# Patient Record
Sex: Male | Born: 1935
Health system: Southern US, Community
[De-identification: ages and names within clinical notes are randomized; demographics above are authoritative.]

## PROBLEM LIST (undated history)

## (undated) DIAGNOSIS — K219 Gastro-esophageal reflux disease without esophagitis: Secondary | ICD-10-CM

## (undated) DIAGNOSIS — I10 Essential (primary) hypertension: Secondary | ICD-10-CM

## (undated) DIAGNOSIS — I714 Abdominal aortic aneurysm, without rupture, unspecified: Secondary | ICD-10-CM

## (undated) DIAGNOSIS — M1990 Unspecified osteoarthritis, unspecified site: Secondary | ICD-10-CM

## (undated) DIAGNOSIS — N189 Chronic kidney disease, unspecified: Secondary | ICD-10-CM

## (undated) DIAGNOSIS — E039 Hypothyroidism, unspecified: Secondary | ICD-10-CM

## (undated) DIAGNOSIS — E7800 Pure hypercholesterolemia, unspecified: Secondary | ICD-10-CM

## (undated) DIAGNOSIS — C61 Malignant neoplasm of prostate: Secondary | ICD-10-CM

## (undated) HISTORY — DX: Essential (primary) hypertension: I10

## (undated) HISTORY — PX: OTHER SURGICAL HISTORY: SHX169

---

## 2007-06-28 ENCOUNTER — Ambulatory Visit: Admission: RE | Admit: 2007-06-28 | Discharge: 2007-09-26 | Payer: Self-pay | Admitting: Radiation Oncology

## 2007-07-05 ENCOUNTER — Encounter: Admission: RE | Admit: 2007-07-05 | Discharge: 2007-07-05 | Payer: Self-pay | Admitting: Urology

## 2007-08-16 ENCOUNTER — Ambulatory Visit (HOSPITAL_BASED_OUTPATIENT_CLINIC_OR_DEPARTMENT_OTHER): Admission: RE | Admit: 2007-08-16 | Discharge: 2007-08-16 | Payer: Self-pay | Admitting: Urology

## 2007-10-05 ENCOUNTER — Ambulatory Visit: Admission: RE | Admit: 2007-10-05 | Discharge: 2007-10-22 | Payer: Self-pay | Admitting: Radiation Oncology

## 2009-10-03 ENCOUNTER — Ambulatory Visit: Payer: Self-pay | Admitting: Family Medicine

## 2009-10-03 ENCOUNTER — Encounter: Admission: RE | Admit: 2009-10-03 | Discharge: 2009-10-03 | Payer: Self-pay | Admitting: Family Medicine

## 2009-10-03 DIAGNOSIS — C61 Malignant neoplasm of prostate: Secondary | ICD-10-CM | POA: Insufficient documentation

## 2009-10-03 DIAGNOSIS — E785 Hyperlipidemia, unspecified: Secondary | ICD-10-CM | POA: Insufficient documentation

## 2009-10-03 DIAGNOSIS — M7580 Other shoulder lesions, unspecified shoulder: Secondary | ICD-10-CM

## 2009-10-03 DIAGNOSIS — I10 Essential (primary) hypertension: Secondary | ICD-10-CM | POA: Insufficient documentation

## 2009-10-03 LAB — CONVERTED CEMR LAB
ALT: 12 units/L (ref 0–53)
AST: 21 units/L (ref 0–37)
Alkaline Phosphatase: 48 units/L (ref 39–117)
BUN: 24 mg/dL — ABNORMAL HIGH (ref 6–23)
CO2: 26 meq/L (ref 19–32)
Calcium: 9.7 mg/dL (ref 8.4–10.5)
Chloride: 104 meq/L (ref 96–112)
HCT: 37.9 % — ABNORMAL LOW (ref 39.0–52.0)
MCV: 94.5 fL (ref 78.0–100.0)
Platelets: 254 10*3/uL (ref 150–400)
Potassium: 4.1 meq/L (ref 3.5–5.3)
Total Bilirubin: 0.6 mg/dL (ref 0.3–1.2)
Total Protein: 7.6 g/dL (ref 6.0–8.3)

## 2009-10-06 ENCOUNTER — Encounter: Payer: Self-pay | Admitting: Family Medicine

## 2010-03-17 NOTE — Letter (Signed)
Summary: Generic Letter  [facility]  [address]   [place], [place] [address]   Phone: [phone]  Fax: [phone]    10/06/2009  Anthony Harmon [address] [place], [place]  [address]  Dear Mr. Fate,   It was a pleasure to meet you in the office last week.  I have received the results of your recent lab tests, and your cholesterol level is quite elevated (LDL "bad" cholesterol is 199).    Also, the x-ray of your shoulder did not show any abnormalities.  I would like to discuss our management of your cholesterol when I see you back.  If your shoulder pain continues, we may want to consider a physical therapy regimen to improve your range-of-motion and reduce your pain.   Please be in touch with any questions.      Sincerely,   Paula Compton MD  Appended Document: Generic Letter mailed

## 2010-03-17 NOTE — Assessment & Plan Note (Signed)
Summary: NP,tcb   Vital Signs:  Patient profile:   77 year old male Height:      72 inches Weight:      197 pounds BMI:     26.81 Pulse rate:   60 / minute BP sitting:   154 / 80  (left arm) Cuff size:   large  Vitals Entered By: Arlyss Repress CMA, (October 03, 2009 1:43 PM) CC: NP. right shoulder pain x 1 month. worse at night. uses Bengay..helps some. Is Patient Diabetic? No Pain Assessment Patient in pain? no        CC:  NP. right shoulder pain x 1 month. worse at night. uses Bengay..helps some.Marland Kitchen  History of Present Illness: Patient new to our practice, here to establish care.   History of elevated cholesterol, previously patient of Dr. Yetta Barre.  Also history prostate cancer s/p prostatectomy 3 yrs ago and followed by urology for this.   Complaint of R shoulder pain that has been ongoing for the past 1 month. Worse when he raises arm over shoulder.  He is R hand dominant.  No loss of strength. No trauma now or in the past. No prior shoulder injury or problem.  Not taking anything for it.  No known drug allergies.  The pain is worse when doing maintenance things (washing mirror overhead, changing light bulb in overhead fixture).      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  Completed 9-12 yrs education.  Smokes cigarettes.  Denies alcohol or recreational drug use. No pets. Lives with wife, [place], age 22.Smoking Status:  current Packs/Day:  0.5  Physical Exam  General:  well appearing, no apparent distress Eyes:  clear sclerae bilaterally Mouth:  moist mucus membranes.  Clear oropharynx.  Neck:  Neck supple. No cervical adenopathy.  Lungs:  Normal respiratory  effort, chest expands symmetrically. Lungs are clear to auscultation, no crackles or wheezes. Heart:  Normal rate and regular rhythm. S1 and S2 normal without gallop, murmur, click, rub or other extra sounds. Abdomen:  Bowel sounds positive,abdomen soft and non-tender without masses, organomegaly or hernias noted. Msk:  No anatomic abnormality on inspection.  Point tenderness over the R [facility] joint and upper scapular area on R.  Full active ROM with both arms/shoulders.  Mildly positive Neer test.  Able to perform internal rotation albeit with some discomfort.  Empty-can test with some discomfort, but can perform.   Handgrip full and symmetric bilat.    Impression & Recommendations:  Problem # 1:  SHOULDER IMPINGEMENT SYNDROME, RIGHT (ICD-726.2) Suspect SIS based on symptoms and Neer test.  NSAIDs for short-course.  to check renal function, with hx of HTN in this 77 yr old man.   Consider exercises if x-ray negative and if no relief with ice, short-course NSAIDs. Orders: Diagnostic X-Ray/Fluoroscopy (Diagnostic X-Ray/Flu)  Problem # 2:  OTHER AND UNSPECIFIED HYPERLIPIDEMIA (ICD-272.4) To check direct LDL.  Meds refilled for now.  Records from Dr Yetta Barre' office His updated medication list for this problem includes:    Fenofibrate Micronized 134 Mg Caps (Fenofibrate micronized) .Marland Kitchen... 1 by mouth once daily  Orders: Comp Met-[facility] [phone]) Direct LDL-[facility] [id number]) CBC-[facility] [phone])  Complete Medication List: 1)  Hydrochlorothiazide 25 Mg Tabs (Hydrochlorothiazide) .Marland Kitchen.. 1 by mouth qd 2)  Fenofibrate Micronized 134 Mg Caps (Fenofibrate micronized) .Marland Kitchen.. 1 by mouth once daily  Patient Instructions: 1)  It was a pleasure to meet you today.  I am glad to be your new doctor.  2)  I believe your right shoulder pain is due to a condition called impingement syndrome.  I recommend you apply cold compresses to the shoulder, and take an anti-inflammatory medication Ibuprofen 400mg  by mouth every 8  hours for the next 10 days.  (Over the counter ibuprofen is 200mg  per tablet; therefore, please take 2 of the over-the-counter tablets every 8 hours). 3)  I am ordering some lab work, and an x-ray of the R shoulder.  4)  I would like to see you back in 2 weeks to see how you are doing.  5)  We will request records from your previous doctor, Dr Yetta Barre.  Prescriptions: FENOFIBRATE MICRONIZED 134 MG CAPS (FENOFIBRATE MICRONIZED) 1 by mouth once daily  #30 x 6   Entered and Authorized by:   Paula Compton MD   Signed by:   Paula Compton MD on 10/03/2009   Method used:   Electronically to        [facility] (retail)       [address]       [place], [place]  [address]       Ph: [phone]       Fax: [phone]   RxID:   [id number]

## 2010-06-30 NOTE — Op Note (Signed)
NAMEVON, QUINTANAR            ACCOUNT NO.:  [id number]   MEDICAL RECORD NO.:  [id number]         PATIENT TYPE:  HAMB   LOCATION:                               FACILITY:  [facility]   PHYSICIAN:  Courtney Paris, M.D.DATE OF BIRTH:  Jul 14, 1932   DATE OF PROCEDURE:  08/16/2007  DATE OF DISCHARGE:                               OPERATIVE REPORT   PREOPERATIVE DIAGNOSIS:  T1C Gleason 3 plus 3 adenocarcinoma of the  prostate.   POSTOPERATIVE DIAGNOSIS:  T1C Gleason 3 plus 3 adenocarcinoma of the  prostate.   OPERATION:  Cystoscopy, brachytherapy of the prostate.   ANESTHESIA:  General.   SURGEON:  Courtney Paris, M.D.   ASSISTANT:  Maryln Gottron, M.D.   BRIEF HISTORY:  This 77 year old patient comes in for definitive  radiation therapy for his prostate cancer.  He was a clinical T1C  Gleason 3 plus 3 mainly at the right apex.  His PSA was 4.1.  He had a  fairly large gland about 64 grams but enters now for definitive  treatment.   PROCEDURE IN DETAIL:  The patient was placed on the operating table in  dorsal lithotomy position, after satisfactory induction of general  endotracheal anesthesia was prepped and draped with Betadine in the  usual sterile fashion.  A rectal tube and the rectal ultrasound probe  were inserted as well as a Foley catheter.  Dr. Dayton Scrape then proceeded to  calculate the dose for his prostate using ultrasound and the computer  for the Nucletron.  He then had his treatment with insertion with total  of 27 needles with 69 seeds of I125 for an excellent implant.   At the completion he was then reprepped and draped and a flexible  cystoscope used to inspect the anterior urethra which was clear.  The  posterior urethra was mildly obstructed and he had a fairly large median  lobe component.  The bladder was entered and carefully inspected.  No  seeds were seen within the bladder proper.  The scope was removed.  A  #18 Foley catheter was then  reinserted.   He was taken to the recovery room in good condition.  We will remove the  Foley tomorrow since it is the fourth of July weekend.  If he has  trouble he will get to the office before we close so we can reinsert the  Foley.  Otherwise he will return in 3 weeks.  He was sent home with  detailed written instructions.  He has been on antibiotics and pain  medicine.      Courtney Paris, M.D.  Electronically Signed    HMK/MEDQ  D:  08/16/2007  T:  08/16/2007  Job:  [id number]

## 2010-11-12 LAB — COMPREHENSIVE METABOLIC PANEL
ALT: 17
AST: 26
Albumin: 4
Alkaline Phosphatase: 55
Calcium: 9.8
Chloride: 107
Creatinine, Ser: 1.69 — ABNORMAL HIGH
GFR calc Af Amer: 49 — ABNORMAL LOW
GFR calc non Af Amer: 40 — ABNORMAL LOW
Glucose, Bld: 99
Potassium: 4.6
Total Protein: 6.7

## 2010-11-12 LAB — CBC
HCT: 36.2 — ABNORMAL LOW
Hemoglobin: 12.1 — ABNORMAL LOW
Platelets: 225
RDW: 12.9
WBC: 8.9

## 2013-04-20 ENCOUNTER — Encounter: Payer: Self-pay | Admitting: Neurology

## 2013-04-23 ENCOUNTER — Ambulatory Visit: Payer: Medicare Other | Admitting: Neurology

## 2013-04-23 ENCOUNTER — Telehealth: Payer: Self-pay | Admitting: Neurology

## 2013-04-23 NOTE — Telephone Encounter (Signed)
No show for new patient visit on 04/23/2013

## 2013-05-10 ENCOUNTER — Ambulatory Visit (INDEPENDENT_AMBULATORY_CARE_PROVIDER_SITE_OTHER): Payer: Medicare Other | Admitting: Neurology

## 2013-05-10 ENCOUNTER — Encounter: Payer: Self-pay | Admitting: Neurology

## 2013-05-10 ENCOUNTER — Encounter (INDEPENDENT_AMBULATORY_CARE_PROVIDER_SITE_OTHER): Payer: Self-pay

## 2013-05-10 VITALS — BP 157/85 | HR 68 | Ht 73.0 in | Wt 204.0 lb

## 2013-05-10 DIAGNOSIS — R51 Headache: Secondary | ICD-10-CM

## 2013-05-10 DIAGNOSIS — R259 Unspecified abnormal involuntary movements: Secondary | ICD-10-CM

## 2013-05-10 DIAGNOSIS — R251 Tremor, unspecified: Secondary | ICD-10-CM

## 2013-05-10 NOTE — Patient Instructions (Signed)
Overall you are doing fairly well but I do want to suggest a few things today:   Remember to drink plenty of fluid, eat healthy meals and do not skip any meals. Try to eat protein with a every meal and eat a healthy snack such as fruit or nuts in between meals. Try to keep a regular sleep-wake schedule and try to exercise daily, particularly in the form of walking, 20-30 minutes a day, if you can.   Your exam and history is most consistent with a diagnosis of essential tremor  I would like to see you back in 6 months, sooner if we need to. Please call us with any interim questions, concerns, problems, updates or refill requests.   My clinical assistant and will answer any of your questions and relay your messages to me and also relay most of my messages to you.   Our phone number is [phone]. We also have an after hours call service for urgent matters and there is a physician on-call for urgent questions. For any emergencies you know to call 911 or go to the nearest emergency room

## 2013-05-10 NOTE — Progress Notes (Signed)
[facility]    Provider:  Dr Janann Colonel Referring Provider: No ref. provider found Primary Care Physician:  Benito Mccreedy, MD  CC:  Tremor   HPI:  Anthony Harmon is a 80 y.o. male here as a referral from Dr. Vista Lawman evaluated for headache and tremor. Notes tremor started a few years. Describes it predominantly as a rest tremor. No noted action or postural component. No generalized bradykinesia, no slowing down of his walking. No gait instability. Denies any muscle cramping or aching. Has had some episodes of REM behavior disorder in the past. He does not feel symptoms are getting worse. No family history of tremor. No aggravating factors. Not worse with caffeine. Does not drink. No history of exposure to dopamine blocking agents.   He currently denies any headaches.   Review of Systems: Out of a complete 14 system review, the patient complains of only the following symptoms, and all other reviewed systems are negative. + snoring, sleepiness  History   Social History  . Marital Status: Married    Spouse Name: N/A    Number of Children: N/A  . Years of Education: N/A   Occupational History  . Not on file.   Social History Main Topics  . Smoking status: Current Every Day Smoker  . Smokeless tobacco: Never Used  . Alcohol Use: No  . Drug Use: No  . Sexual Activity: Not on file   Other Topics Concern  . Not on file   Social History Narrative   Married, 5 children   Right handed   10th grade   1 cup daily    No family history on file.  Past Medical History  Diagnosis Date  . Hypertension     Past Surgical History  Procedure Laterality Date  . None      Current Outpatient Prescriptions  Medication Sig Dispense Refill  . amLODipine (NORVASC) 5 MG tablet       . levothyroxine (SYNTHROID, LEVOTHROID) 25 MCG tablet       . nortriptyline (PAMELOR) 25 MG capsule       . pravastatin (PRAVACHOL) 20 MG tablet       . traZODone (DESYREL) 50 MG  tablet        No current facility-administered medications for this visit.    Allergies as of 05/10/2013  . (No Known Allergies)    Vitals: BP 157/85  Pulse 68  Ht 6\' 1"  (1.854 m)  Wt 204 lb (92.534 kg)  BMI 26.92 kg/m2 Last Weight:  Wt Readings from Last 1 Encounters:  05/10/13 204 lb (92.534 kg)   Last Height:   Ht Readings from Last 1 Encounters:  05/10/13 6\' 1"  (1.854 m)     Physical exam: Exam: Gen: NAD, conversant Eyes: anicteric sclerae, moist conjunctivae HENT: Atraumatic, oropharynx clear Neck: Trachea midline; supple,  Lungs: CTA, no wheezing, rales, rhonic                          CV: RRR, no MRG Abdomen: Soft, non-tender;  Extremities: No peripheral edema  Skin: Normal temperature, no rash,  Psych: Appropriate affect, pleasant  Neuro: MS: AA&Ox3, appropriately interactive, normal affect   Speech: fluent w/o paraphasic error  Memory: good recent and remote recall  CN: PERRL, EOMI no nystagmus, no ptosis, sensation intact to LT V1-V3 bilat, face symmetric, no weakness, hearing grossly intact, palate elevates symmetrically, shoulder shrug 5/5 bilat,  tongue protrudes midline, no fasiculations noted.  Motor: normal bulk and  tone Strength: 5/5  In all extremities  Coord: rapid alternating and point-to-point (FNF, HTS) movements intact. Very mild resting tremor of left hand noted, minimal postural and intention tremor noted L>R. Large, messy handwriting. No bradykinesia noted with finger taps, hand opening, foot tapping  Reflexes: symmetrical, bilat downgoing toes  Sens: LT intact in all extremities  Gait: posture, stance, stride and arm-swing normal. Tandem gait intact. Able to walk on heels and toes. Romberg absent.   Assessment:  After physical and neurologic examination, review of laboratory studies, imaging, neurophysiology testing and pre-existing records, assessment will be reviewed on the problem list.  Plan:  Treatment plan and additional  workup will be reviewed under Problem List.  1)Tremor 2)headache  80y/o gentleman presenting for initial evaluation of tremor. Patient reports having tremor for years with little to no progression in symptoms. Describes it predominantly as a rest tremor but states it only bothers him when writing. Notes his writing has gotten larger and messier. Differential would include an essential tremor vs parkinsonism. Based on clinical history and exam suspect this likely represents an essential tremor. As symptoms are mild, patient does not wish to start medication at this time. He denies any current headache symptoms, appears to be tolerating Pamelor well. Follow up in 6 months.   Jim Like, DO  [facility] [address] [place], [place] [address]  Phone [phone] Fax [phone]

## 2013-11-12 ENCOUNTER — Ambulatory Visit: Payer: Medicare Other | Admitting: Neurology

## 2014-04-11 DIAGNOSIS — R6889 Other general symptoms and signs: Secondary | ICD-10-CM | POA: Diagnosis not present

## 2014-04-30 DIAGNOSIS — N183 Chronic kidney disease, stage 3 (moderate): Secondary | ICD-10-CM | POA: Diagnosis not present

## 2014-04-30 DIAGNOSIS — Z0000 Encounter for general adult medical examination without abnormal findings: Secondary | ICD-10-CM | POA: Diagnosis not present

## 2014-04-30 DIAGNOSIS — I119 Hypertensive heart disease without heart failure: Secondary | ICD-10-CM | POA: Diagnosis not present

## 2014-04-30 DIAGNOSIS — E785 Hyperlipidemia, unspecified: Secondary | ICD-10-CM | POA: Diagnosis not present

## 2014-04-30 DIAGNOSIS — Z7689 Persons encountering health services in other specified circumstances: Secondary | ICD-10-CM | POA: Diagnosis not present

## 2014-04-30 DIAGNOSIS — Z01118 Encounter for examination of ears and hearing with other abnormal findings: Secondary | ICD-10-CM | POA: Diagnosis not present

## 2014-04-30 DIAGNOSIS — Z0100 Encounter for examination of eyes and vision without abnormal findings: Secondary | ICD-10-CM | POA: Diagnosis not present

## 2014-04-30 DIAGNOSIS — Z1389 Encounter for screening for other disorder: Secondary | ICD-10-CM | POA: Diagnosis not present

## 2014-07-16 DIAGNOSIS — R6889 Other general symptoms and signs: Secondary | ICD-10-CM | POA: Diagnosis not present

## 2015-02-18 DIAGNOSIS — Z0000 Encounter for general adult medical examination without abnormal findings: Secondary | ICD-10-CM | POA: Diagnosis not present

## 2015-02-18 DIAGNOSIS — E785 Hyperlipidemia, unspecified: Secondary | ICD-10-CM | POA: Diagnosis not present

## 2015-04-14 DIAGNOSIS — N183 Chronic kidney disease, stage 3 (moderate): Secondary | ICD-10-CM | POA: Diagnosis not present

## 2015-04-14 DIAGNOSIS — I10 Essential (primary) hypertension: Secondary | ICD-10-CM | POA: Diagnosis not present

## 2015-04-22 DIAGNOSIS — H538 Other visual disturbances: Secondary | ICD-10-CM | POA: Diagnosis not present

## 2015-04-22 DIAGNOSIS — Z0000 Encounter for general adult medical examination without abnormal findings: Secondary | ICD-10-CM | POA: Diagnosis not present

## 2015-04-22 DIAGNOSIS — Z01118 Encounter for examination of ears and hearing with other abnormal findings: Secondary | ICD-10-CM | POA: Diagnosis not present

## 2015-04-24 DIAGNOSIS — I10 Essential (primary) hypertension: Secondary | ICD-10-CM | POA: Diagnosis not present

## 2015-04-24 DIAGNOSIS — N183 Chronic kidney disease, stage 3 (moderate): Secondary | ICD-10-CM | POA: Diagnosis not present

## 2015-05-06 DIAGNOSIS — I10 Essential (primary) hypertension: Secondary | ICD-10-CM | POA: Diagnosis not present

## 2015-05-06 DIAGNOSIS — I119 Hypertensive heart disease without heart failure: Secondary | ICD-10-CM | POA: Diagnosis not present

## 2015-05-06 DIAGNOSIS — Z720 Tobacco use: Secondary | ICD-10-CM | POA: Diagnosis not present

## 2015-05-06 DIAGNOSIS — N183 Chronic kidney disease, stage 3 (moderate): Secondary | ICD-10-CM | POA: Diagnosis not present

## 2015-05-06 DIAGNOSIS — E039 Hypothyroidism, unspecified: Secondary | ICD-10-CM | POA: Diagnosis not present

## 2015-05-06 DIAGNOSIS — E785 Hyperlipidemia, unspecified: Secondary | ICD-10-CM | POA: Diagnosis not present

## 2015-06-05 DIAGNOSIS — I10 Essential (primary) hypertension: Secondary | ICD-10-CM | POA: Diagnosis not present

## 2015-06-05 DIAGNOSIS — N183 Chronic kidney disease, stage 3 (moderate): Secondary | ICD-10-CM | POA: Diagnosis not present

## 2015-06-26 DIAGNOSIS — N183 Chronic kidney disease, stage 3 (moderate): Secondary | ICD-10-CM | POA: Diagnosis not present

## 2015-06-26 DIAGNOSIS — I10 Essential (primary) hypertension: Secondary | ICD-10-CM | POA: Diagnosis not present

## 2015-07-25 DIAGNOSIS — I10 Essential (primary) hypertension: Secondary | ICD-10-CM | POA: Diagnosis not present

## 2015-07-25 DIAGNOSIS — N183 Chronic kidney disease, stage 3 (moderate): Secondary | ICD-10-CM | POA: Diagnosis not present

## 2015-09-10 DIAGNOSIS — N183 Chronic kidney disease, stage 3 (moderate): Secondary | ICD-10-CM | POA: Diagnosis not present

## 2015-09-10 DIAGNOSIS — I10 Essential (primary) hypertension: Secondary | ICD-10-CM | POA: Diagnosis not present

## 2015-09-30 DIAGNOSIS — N183 Chronic kidney disease, stage 3 (moderate): Secondary | ICD-10-CM | POA: Diagnosis not present

## 2015-09-30 DIAGNOSIS — I10 Essential (primary) hypertension: Secondary | ICD-10-CM | POA: Diagnosis not present

## 2015-11-13 DIAGNOSIS — I10 Essential (primary) hypertension: Secondary | ICD-10-CM | POA: Diagnosis not present

## 2015-11-13 DIAGNOSIS — N183 Chronic kidney disease, stage 3 (moderate): Secondary | ICD-10-CM | POA: Diagnosis not present

## 2015-12-11 DIAGNOSIS — I10 Essential (primary) hypertension: Secondary | ICD-10-CM | POA: Diagnosis not present

## 2015-12-11 DIAGNOSIS — N183 Chronic kidney disease, stage 3 (moderate): Secondary | ICD-10-CM | POA: Diagnosis not present

## 2015-12-16 DIAGNOSIS — I10 Essential (primary) hypertension: Secondary | ICD-10-CM | POA: Diagnosis not present

## 2015-12-16 DIAGNOSIS — Z131 Encounter for screening for diabetes mellitus: Secondary | ICD-10-CM | POA: Diagnosis not present

## 2015-12-16 DIAGNOSIS — E559 Vitamin D deficiency, unspecified: Secondary | ICD-10-CM | POA: Diagnosis not present

## 2015-12-16 DIAGNOSIS — E785 Hyperlipidemia, unspecified: Secondary | ICD-10-CM | POA: Diagnosis not present

## 2015-12-16 DIAGNOSIS — I119 Hypertensive heart disease without heart failure: Secondary | ICD-10-CM | POA: Diagnosis not present

## 2015-12-16 DIAGNOSIS — E039 Hypothyroidism, unspecified: Secondary | ICD-10-CM | POA: Diagnosis not present

## 2015-12-16 DIAGNOSIS — D649 Anemia, unspecified: Secondary | ICD-10-CM | POA: Diagnosis not present

## 2015-12-16 DIAGNOSIS — Z720 Tobacco use: Secondary | ICD-10-CM | POA: Diagnosis not present

## 2015-12-18 DIAGNOSIS — N183 Chronic kidney disease, stage 3 (moderate): Secondary | ICD-10-CM | POA: Diagnosis not present

## 2015-12-18 DIAGNOSIS — Z131 Encounter for screening for diabetes mellitus: Secondary | ICD-10-CM | POA: Diagnosis not present

## 2015-12-18 DIAGNOSIS — I119 Hypertensive heart disease without heart failure: Secondary | ICD-10-CM | POA: Diagnosis not present

## 2016-01-07 DIAGNOSIS — I10 Essential (primary) hypertension: Secondary | ICD-10-CM | POA: Diagnosis not present

## 2016-01-07 DIAGNOSIS — N183 Chronic kidney disease, stage 3 (moderate): Secondary | ICD-10-CM | POA: Diagnosis not present

## 2016-02-05 DIAGNOSIS — I10 Essential (primary) hypertension: Secondary | ICD-10-CM | POA: Diagnosis not present

## 2016-02-05 DIAGNOSIS — N183 Chronic kidney disease, stage 3 (moderate): Secondary | ICD-10-CM | POA: Diagnosis not present

## 2016-04-29 DIAGNOSIS — I10 Essential (primary) hypertension: Secondary | ICD-10-CM | POA: Diagnosis not present

## 2016-04-29 DIAGNOSIS — Z131 Encounter for screening for diabetes mellitus: Secondary | ICD-10-CM | POA: Diagnosis not present

## 2016-04-29 DIAGNOSIS — E039 Hypothyroidism, unspecified: Secondary | ICD-10-CM | POA: Diagnosis not present

## 2016-04-29 DIAGNOSIS — E784 Other hyperlipidemia: Secondary | ICD-10-CM | POA: Diagnosis not present

## 2016-07-27 DIAGNOSIS — Z23 Encounter for immunization: Secondary | ICD-10-CM | POA: Diagnosis not present

## 2016-07-27 DIAGNOSIS — I10 Essential (primary) hypertension: Secondary | ICD-10-CM | POA: Diagnosis not present

## 2016-07-27 DIAGNOSIS — E039 Hypothyroidism, unspecified: Secondary | ICD-10-CM | POA: Diagnosis not present

## 2016-07-27 DIAGNOSIS — K30 Functional dyspepsia: Secondary | ICD-10-CM | POA: Diagnosis not present

## 2016-07-27 DIAGNOSIS — N183 Chronic kidney disease, stage 3 (moderate): Secondary | ICD-10-CM | POA: Diagnosis not present

## 2016-11-10 DIAGNOSIS — I10 Essential (primary) hypertension: Secondary | ICD-10-CM | POA: Diagnosis not present

## 2016-11-10 DIAGNOSIS — M545 Low back pain: Secondary | ICD-10-CM | POA: Diagnosis not present

## 2016-11-10 DIAGNOSIS — N183 Chronic kidney disease, stage 3 (moderate): Secondary | ICD-10-CM | POA: Diagnosis not present

## 2016-11-10 DIAGNOSIS — E039 Hypothyroidism, unspecified: Secondary | ICD-10-CM | POA: Diagnosis not present

## 2016-11-10 DIAGNOSIS — Z23 Encounter for immunization: Secondary | ICD-10-CM | POA: Diagnosis not present

## 2016-12-14 DIAGNOSIS — E039 Hypothyroidism, unspecified: Secondary | ICD-10-CM | POA: Diagnosis not present

## 2016-12-14 DIAGNOSIS — N183 Chronic kidney disease, stage 3 (moderate): Secondary | ICD-10-CM | POA: Diagnosis not present

## 2016-12-14 DIAGNOSIS — M545 Low back pain: Secondary | ICD-10-CM | POA: Diagnosis not present

## 2016-12-14 DIAGNOSIS — I10 Essential (primary) hypertension: Secondary | ICD-10-CM | POA: Diagnosis not present

## 2017-01-08 ENCOUNTER — Other Ambulatory Visit: Payer: Self-pay

## 2017-01-08 ENCOUNTER — Emergency Department (HOSPITAL_COMMUNITY): Payer: Medicare Other

## 2017-01-08 ENCOUNTER — Encounter (HOSPITAL_COMMUNITY): Payer: Self-pay

## 2017-01-08 ENCOUNTER — Emergency Department (HOSPITAL_COMMUNITY)
Admission: EM | Admit: 2017-01-08 | Discharge: 2017-01-08 | Disposition: A | Discharge status: Home / Self Care | Payer: Medicare Other | Attending: Emergency Medicine | Admitting: Emergency Medicine

## 2017-01-08 DIAGNOSIS — M545 Low back pain, unspecified: Secondary | ICD-10-CM

## 2017-01-08 DIAGNOSIS — G8929 Other chronic pain: Secondary | ICD-10-CM | POA: Insufficient documentation

## 2017-01-08 DIAGNOSIS — I10 Essential (primary) hypertension: Secondary | ICD-10-CM | POA: Diagnosis not present

## 2017-01-08 DIAGNOSIS — Z79899 Other long term (current) drug therapy: Secondary | ICD-10-CM | POA: Insufficient documentation

## 2017-01-08 DIAGNOSIS — Z87891 Personal history of nicotine dependence: Secondary | ICD-10-CM | POA: Diagnosis not present

## 2017-01-08 DIAGNOSIS — M546 Pain in thoracic spine: Secondary | ICD-10-CM | POA: Diagnosis not present

## 2017-01-08 HISTORY — DX: Pure hypercholesterolemia, unspecified: E78.00

## 2017-01-08 HISTORY — DX: Unspecified osteoarthritis, unspecified site: M19.90

## 2017-01-08 LAB — URINALYSIS, ROUTINE W REFLEX MICROSCOPIC
Bilirubin Urine: NEGATIVE
Glucose, UA: NEGATIVE mg/dL
HGB URINE DIPSTICK: NEGATIVE
Ketones, ur: NEGATIVE mg/dL
LEUKOCYTES UA: NEGATIVE
NITRITE: NEGATIVE
PROTEIN: NEGATIVE mg/dL
SPECIFIC GRAVITY, URINE: 1.015 (ref 1.005–1.030)
pH: 5 (ref 5.0–8.0)

## 2017-01-08 MED ORDER — IBUPROFEN 400 MG PO TABS
600.0000 mg | ORAL_TABLET | Freq: Once | ORAL | Status: AC
  Administered 2017-01-08: 600 mg via ORAL
  Filled 2017-01-08: qty 1

## 2017-01-08 MED ORDER — ACETAMINOPHEN 500 MG PO TABS
1000.0000 mg | ORAL_TABLET | Freq: Once | ORAL | Status: AC
  Administered 2017-01-08: 1000 mg via ORAL
  Filled 2017-01-08: qty 2

## 2017-01-08 NOTE — ED Notes (Signed)
Patient transported to X-ray 

## 2017-01-08 NOTE — ED Notes (Signed)
Notified pt of need for urine.

## 2017-01-08 NOTE — ED Provider Notes (Signed)
[facility] EMERGENCY DEPARTMENT Provider Note   CSN: [id number] Arrival date & time: 01/08/17  7673     History   Chief Complaint Chief Complaint  Patient presents with  . Back Pain    HPI Anthony Harmon is a 83 y.o. male with history of HTN, hyperlipidemia and chronic low back pain presents to ED for midline, low back pain for "long time", worsening over the past month. Pain is intermittent in, midline and radiates to bilateral thighs. Pain became worse when patient started using his indoor bike to exercise one month ago. He worked in Theatre manager for many years. Aggravating factors include bending at the waist and palpation. Was evaluated by PCP and had x-rays done and the last 2-3 weeks, was told he had arthritis in his back and was given pain medicines. Patient states pain medicines don't help, has found relief with Advil. Denies falls, previous injury to his back or surgeries, fevers, chills, unintentional weight loss, nausea, vomiting, constipation, diarrhea, abdominal pain, dysuria, hematuria, testicular pain or groin pain, numbness, weakness or tingling to extremities. No bladder/bowel incontinence or retention.  HPI  Past Medical History:  Diagnosis Date  . Arthritis   . High cholesterol   . Hypertension     Patient Active Problem List   Diagnosis Date Noted  . MALIGNANT NEOPLASM OF PROSTATE 10/03/2009  . OTHER AND UNSPECIFIED HYPERLIPIDEMIA 10/03/2009  . ESSENTIAL HYPERTENSION, BENIGN 10/03/2009  . SHOULDER IMPINGEMENT SYNDROME, RIGHT 10/03/2009    Past Surgical History:  Procedure Laterality Date  . None         Home Medications    Prior to Admission medications   Medication Sig Start Date End Date Taking? Authorizing Provider  amLODipine (NORVASC) 5 MG tablet  05/09/13   [provider]  levothyroxine (SYNTHROID, LEVOTHROID) 25 MCG tablet  04/26/13   [provider]  nortriptyline (PAMELOR) 25 MG capsule  04/11/13    [provider]  pravastatin (PRAVACHOL) 20 MG tablet  04/26/13   [provider]  traZODone (DESYREL) 50 MG tablet  04/26/13   [provider]    Family History No family history on file.  Social History Social History   Tobacco Use  . Smoking status: Former Research scientist (life sciences)  . Smokeless tobacco: Never Used  Substance Use Topics  . Alcohol use: No  . Drug use: No     Allergies   Patient has no known allergies.   Review of Systems Review of Systems  Musculoskeletal: Positive for back pain and myalgias.  All other systems reviewed and are negative.    Physical Exam Updated Vital Signs BP (!) 184/88   Pulse (!) 54   Temp 97.8 F (36.6 C) (Oral)   Resp 18   SpO2 98%   Physical Exam  Constitutional: He is oriented to person, place, and time. He appears well-developed and well-nourished. No distress.  NAD. Appears younger than stated age.   HENT:  Head: Normocephalic and atraumatic.  Right Ear: External ear normal.  Left Ear: External ear normal.  Nose: Nose normal.  Eyes: Conjunctivae and EOM are normal. No scleral icterus.  Neck: Normal range of motion. Neck supple.  No midline cervical spine tenderness No cervical paraspinal muscular tenderness or increased tone Full AROM of cervical spine without pain or rigidity   Cardiovascular: Normal rate, regular rhythm, S1 normal, S2 normal, normal heart sounds and intact distal pulses.  No murmur heard. Pulses:      Radial pulses are 2+ on the  right side, and 2+ on the left side.       Dorsalis pedis pulses are 2+ on the right side, and 2+ on the left side.  Pulmonary/Chest: Effort normal and breath sounds normal. He has no decreased breath sounds. He has no wheezes. He exhibits no tenderness.  Abdominal: Soft. Normal appearance and bowel sounds are normal. There is no tenderness.  No suprapubic or CVA tenderness   Musculoskeletal: Normal range of motion. He exhibits tenderness. He exhibits no  deformity.       Lumbar back: He exhibits tenderness and pain.  Lower T spine and L spine spinous process tenderness No T/L spine paraspinal muscular tenderness or increased tone  SI joints and sciatic notches non tender, bilaterally  Full PROM hip bilaterally without reported pain Negative SLR.  Negative Faber.  Negative Stinchfield test.   Neurological: He is alert and oriented to person, place, and time.  5/5 strength with flexion/extension of hip, knee and ankle, bilaterally.  Sensation to light touch intact in lower extremities including feet  Skin: Skin is warm and dry. Capillary refill takes less than 2 seconds.  Psychiatric: He has a normal mood and affect. His behavior is normal. Judgment and thought content normal.  Nursing note and vitals reviewed.    ED Treatments / Results  Labs (all labs ordered are listed, but only abnormal results are displayed) Labs Reviewed  URINALYSIS, ROUTINE W REFLEX MICROSCOPIC    EKG  EKG Interpretation None       Radiology Dg Thoracic Spine 2 View  Result Date: 01/08/2017 CLINICAL DATA:  Chronic low back pain. EXAM: THORACIC SPINE 2 VIEWS COMPARISON:  Radiographs of Jul 05, 2007. FINDINGS: There is no evidence of thoracic spine fracture. Alignment is normal. No other significant bone abnormalities are identified. IMPRESSION: Normal thoracic spine. Electronically Signed   By: Marijo Conception, M.D.   On: 01/08/2017 09:49   Dg Lumbar Spine Complete  Result Date: 01/08/2017 CLINICAL DATA:  83 year old male with history of low back pain chronic and getting worse over the past several days. EXAM: LUMBAR SPINE - COMPLETE 4+ VIEW COMPARISON:  No priors. FINDINGS: Five views of the lumbar spine demonstrate no acute displaced fracture. Mild straightening of normal lumbar lordosis, likely chronic. Multilevel degenerative disc disease, most severe at L5-S1 where there is near complete bony fusion. Multilevel facet arthropathy, most severe at L4-L5  and L5-S1. No defects of the pars interarticularis. Multiple brachytherapy seeds noted projecting over the low pelvis, presumably in the region of the prostate gland. IMPRESSION: 1. No acute radiographic abnormality of the lumbar spine. 2. Multilevel degenerative disc disease and lumbar spondylosis, as above. Electronically Signed   By: Vinnie Langton M.D.   On: 01/08/2017 09:55    Procedures Procedures (including critical care time)  Medications Ordered in ED Medications  acetaminophen (TYLENOL) tablet 1,000 mg (1,000 mg Oral Given 01/08/17 1012)  ibuprofen (ADVIL,MOTRIN) tablet 600 mg (600 mg Oral Given 01/08/17 1012)     Initial Impression / Assessment and Plan / ED Course  I have reviewed the triage vital signs and the nursing notes.  Pertinent labs & imaging results that were available during my care of the patient were reviewed by me and considered in my medical decision making (see chart for details).    83 year old male with history of chronic back pain presents for acute on chronic back pain. Atraumatic. No other systemic symptoms.  On exam, VS are WNL. Abdominal exam reassuring without suprapubic or CVAT. Distal  pulses symmetric bilaterally.  No focal neurological deficits appreciated. Patient is ambulatory. He has reproducible TL spine midline tenderness w/o step offs.   Considered ruptured disc, UTI/pyelo, PID, kidney stone, cauda equina or epidural abscess however these don't fit clinical picture. No red flag symptoms of back pain including: bladder/bowel incontinence or retention, night sweats, night pain, fevers or weight loss, IVDU, recent trauma or falls.   TL spine x-rays and U/A unremarkable, show diffuse arthropathy in spine.  He has been seen for back pain by PCP in the past and is taking pain medications that he says don't work, he does get relieve with advil.  Will dc with conservative measures such as ice/heat, mild stretches, NSAIDs with PCP follow-up if no  improvement with conservative management. ED return precautions discussed with patient who verbalized understanding and is agreeable to plan.   Patient, ED treatment and discharge plan was discussed with supervising physician who also evaluated the patient and is agreeable with plan.   Final Clinical Impressions(s) / ED Diagnoses   Final diagnoses:  Chronic midline low back pain without sciatica    ED Discharge Orders    None       Kinnie Feil, PA-C 01/08/17 1057    Forde Dandy, MD 01/08/17 1253

## 2017-01-08 NOTE — ED Triage Notes (Signed)
Patient complains of ongoing lower back pain that he describes as chronic and has gotten worse the past several days, pain increased with any change in position, NAD

## 2017-01-08 NOTE — Discharge Instructions (Signed)
Your x-ray shows multilevel arthritis but no other acute injuries.  You can take 1000 mg of Tylenol +400 mg of ibuprofen for pain every 8 hours. Follow up with a primary care doctor for reevaluation within 1 week to see if symptoms improve. Since her symptoms are chronic, you may need referral to pain clinic. Return to the ED if you develop fevers, abdominal pain, difficulty urinating, bladder/bowel contents or retention or numbness or weakness to your extremities.

## 2017-01-10 ENCOUNTER — Other Ambulatory Visit: Payer: Self-pay

## 2017-01-10 NOTE — Patient Outreach (Signed)
Outreach patient after ED visit on 01/08/17 at [facility].  Spoke with patient and he gave me permission to speak with his wife due to patient not being able to hear well.  Verified PCP and updated in system.  Once PCP was verified it was found that the PCP is not in [place].

## 2017-02-17 DIAGNOSIS — J209 Acute bronchitis, unspecified: Secondary | ICD-10-CM | POA: Diagnosis not present

## 2017-02-17 DIAGNOSIS — I10 Essential (primary) hypertension: Secondary | ICD-10-CM | POA: Diagnosis not present

## 2017-02-17 DIAGNOSIS — M545 Low back pain: Secondary | ICD-10-CM | POA: Diagnosis not present

## 2017-02-17 DIAGNOSIS — J302 Other seasonal allergic rhinitis: Secondary | ICD-10-CM | POA: Diagnosis not present

## 2017-02-21 DIAGNOSIS — H40033 Anatomical narrow angle, bilateral: Secondary | ICD-10-CM | POA: Diagnosis not present

## 2017-02-21 DIAGNOSIS — H2513 Age-related nuclear cataract, bilateral: Secondary | ICD-10-CM | POA: Diagnosis not present

## 2017-02-23 DIAGNOSIS — M5146 Schmorl's nodes, lumbar region: Secondary | ICD-10-CM | POA: Diagnosis not present

## 2017-02-23 DIAGNOSIS — M2578 Osteophyte, vertebrae: Secondary | ICD-10-CM | POA: Diagnosis not present

## 2017-02-23 DIAGNOSIS — M5126 Other intervertebral disc displacement, lumbar region: Secondary | ICD-10-CM | POA: Diagnosis not present

## 2017-02-23 DIAGNOSIS — M47816 Spondylosis without myelopathy or radiculopathy, lumbar region: Secondary | ICD-10-CM | POA: Diagnosis not present

## 2017-02-23 DIAGNOSIS — I714 Abdominal aortic aneurysm, without rupture: Secondary | ICD-10-CM | POA: Diagnosis not present

## 2017-03-01 DIAGNOSIS — E039 Hypothyroidism, unspecified: Secondary | ICD-10-CM | POA: Diagnosis not present

## 2017-03-01 DIAGNOSIS — I10 Essential (primary) hypertension: Secondary | ICD-10-CM | POA: Diagnosis not present

## 2017-03-01 DIAGNOSIS — M5136 Other intervertebral disc degeneration, lumbar region: Secondary | ICD-10-CM | POA: Diagnosis not present

## 2017-03-01 DIAGNOSIS — I714 Abdominal aortic aneurysm, without rupture: Secondary | ICD-10-CM | POA: Diagnosis not present

## 2017-03-11 ENCOUNTER — Other Ambulatory Visit: Payer: Self-pay

## 2017-03-11 DIAGNOSIS — I714 Abdominal aortic aneurysm, without rupture, unspecified: Secondary | ICD-10-CM

## 2017-03-29 ENCOUNTER — Ambulatory Visit (HOSPITAL_COMMUNITY)
Admission: RE | Admit: 2017-03-29 | Discharge: 2017-03-29 | Disposition: A | Discharge status: Home / Self Care | Payer: Medicare Other | Source: Ambulatory Visit | Attending: Vascular Surgery | Admitting: Vascular Surgery

## 2017-03-29 DIAGNOSIS — I714 Abdominal aortic aneurysm, without rupture, unspecified: Secondary | ICD-10-CM

## 2017-03-29 DIAGNOSIS — I77811 Abdominal aortic ectasia: Secondary | ICD-10-CM | POA: Diagnosis not present

## 2017-03-29 DIAGNOSIS — I7409 Other arterial embolism and thrombosis of abdominal aorta: Secondary | ICD-10-CM | POA: Insufficient documentation

## 2017-03-30 ENCOUNTER — Encounter: Payer: Self-pay | Admitting: Vascular Surgery

## 2017-03-30 ENCOUNTER — Other Ambulatory Visit: Payer: Self-pay

## 2017-03-30 ENCOUNTER — Ambulatory Visit: Payer: Medicare Other | Admitting: Vascular Surgery

## 2017-03-30 VITALS — BP 163/79 | HR 62 | Temp 98.0°F | Resp 16 | Ht 73.0 in | Wt 210.0 lb

## 2017-03-30 DIAGNOSIS — I714 Abdominal aortic aneurysm, without rupture, unspecified: Secondary | ICD-10-CM

## 2017-03-30 DIAGNOSIS — I713 Abdominal aortic aneurysm, ruptured, unspecified: Secondary | ICD-10-CM

## 2017-03-30 NOTE — Progress Notes (Signed)
Patient ID: Anthony [place], male   DOB: 1932/03/01, 84 y.o.   MRN: [id number]  Reason for Consult: No chief complaint on file.   Referred by Nolene Ebbs, MD  Subjective:     HPI:  Anthony [place]on is a 84 y.o. male without any previous surgical history.  He does have hypertension and is a former smoker having quit 2 years ago.  He does not know of any family history of aneurysmal disease.  He does have chronic back pain for which she recently underwent MRI which discovered an aneurysm.  He is now here to discuss that.  He has no new back or abdominal pain.  He has never known about this aneurysm before.  He stays very active is able to walk without limitation.  He has never had stroke or heart attack.  Past Medical History:  Diagnosis Date  . Arthritis   . High cholesterol   . Hypertension    No family history on file. Past Surgical History:  Procedure Laterality Date  . None      Short Social History:  Social History   Tobacco Use  . Smoking status: Former Research scientist (life sciences)  . Smokeless tobacco: Never Used  Substance Use Topics  . Alcohol use: No    No Known Allergies  Current Outpatient Medications  Medication Sig Dispense Refill  . amLODipine (NORVASC) 5 MG tablet     . levothyroxine (SYNTHROID, LEVOTHROID) 25 MCG tablet     . nortriptyline (PAMELOR) 25 MG capsule     . pravastatin (PRAVACHOL) 20 MG tablet     . traZODone (DESYREL) 50 MG tablet      No current facility-administered medications for this visit.     Review of Systems  Constitutional:  Constitutional negative. HENT: HENT negative.  Eyes: Eyes negative.  Respiratory: Positive for cough.  GI: Gastrointestinal negative.  Musculoskeletal: Positive for back pain.  Skin: Skin negative.  Neurological: Neurological negative. Hematologic: Hematologic/lymphatic negative.  Psychiatric: Psychiatric negative.        Objective:  Objective   There were no vitals filed for this visit. There is no height or  weight on file to calculate BMI.  Physical Exam  Constitutional: He is oriented to person, place, and time. He appears well-developed.  HENT:  Head: Normocephalic.  Eyes: Pupils are equal, round, and reactive to light.  Neck: Normal range of motion. Neck supple.  Cardiovascular: Normal rate.  Pulses:      Radial pulses are 2+ on the right side, and 2+ on the left side.       Femoral pulses are 2+ on the right side, and 2+ on the left side.      Popliteal pulses are 2+ on the right side, and 2+ on the left side.  Pulmonary/Chest: Effort normal.  Abdominal: Soft. He exhibits no mass.  Musculoskeletal: Normal range of motion. He exhibits no edema.  Lymphadenopathy:    He has no cervical adenopathy.  Neurological: He is alert and oriented to person, place, and time.  Skin: Skin is warm and dry.  Psychiatric: He has a normal mood and affect. His behavior is normal. Judgment and thought content normal.    Data: I have independently reviewed his images from his aortic duplex which demonstrated a 5.2 cm aneurysm with thrombus there is one area that also appears to be 5.5 cm.  I have also reviewed his MRI from [facility] which demonstrates an infrarenal aneurysm to the level of the aortic bifurcation measuring at least 5  cm in diameter.     Assessment/Plan:     84 year old male presents after having incidental finding of abdominal aortic aneurysm on recent MRI for back pain.  He is slated to see a Psychologist, sport and exercise about his back soon.  I discussed with him the symptoms of rupture and also the expected outcomes.  At this time we will proceed with CT scan to evaluate his aneurysm and his options for repair.  He demonstrates good understanding in the presence of his wife and will see him back in a few weeks.     Waynetta Sandy MD [facility]

## 2017-04-01 ENCOUNTER — Encounter: Payer: Self-pay | Admitting: Internal Medicine

## 2017-04-22 ENCOUNTER — Ambulatory Visit: Payer: Medicare Other | Admitting: Vascular Surgery

## 2017-04-22 ENCOUNTER — Encounter: Payer: Self-pay | Admitting: Vascular Surgery

## 2017-04-22 ENCOUNTER — Other Ambulatory Visit: Payer: Self-pay

## 2017-04-22 ENCOUNTER — Other Ambulatory Visit: Payer: Self-pay | Admitting: *Deleted

## 2017-04-22 ENCOUNTER — Ambulatory Visit
Admission: RE | Admit: 2017-04-22 | Discharge: 2017-04-22 | Disposition: A | Discharge status: Home / Self Care | Payer: Medicare Other | Source: Ambulatory Visit | Attending: Vascular Surgery | Admitting: Vascular Surgery

## 2017-04-22 ENCOUNTER — Other Ambulatory Visit: Payer: Self-pay | Admitting: Vascular Surgery

## 2017-04-22 ENCOUNTER — Encounter: Payer: Self-pay | Admitting: *Deleted

## 2017-04-22 VITALS — BP 171/91 | HR 67 | Temp 97.3°F | Resp 16 | Ht 73.0 in | Wt 209.0 lb

## 2017-04-22 DIAGNOSIS — I714 Abdominal aortic aneurysm, without rupture, unspecified: Secondary | ICD-10-CM

## 2017-04-22 DIAGNOSIS — I713 Abdominal aortic aneurysm, ruptured, unspecified: Secondary | ICD-10-CM

## 2017-04-22 NOTE — Progress Notes (Signed)
   Patient ID: Anthony [place], male   DOB: 23-May-1932, 84 y.o.   MRN: [id number]  Reason for Consult: Aneurysm (2-3 wk f/u CTA Abd/pel.)   Referred by Nolene Ebbs, MD  Subjective:     HPI:  Anthony [place]on is a 84 y.o. male with history of hypertension and hyperlipidemia.  He was recently evaluated for aortic duplex which demonstrated 5.5 cm aneurysm that was found incidentally on MRI for back pain.  He now presents with noncontrasted CT scan that was done given elevated creatinine.  He has no new back or abdominal pain.  Past Medical History:  Diagnosis Date  . Arthritis   . High cholesterol   . Hypertension    No family history on file. Past Surgical History:  Procedure Laterality Date  . None      Short Social History:  Social History   Tobacco Use  . Smoking status: Former Smoker    Last attempt to quit: 04/23/2015    Years since quitting: 2.0  . Smokeless tobacco: Never Used  Substance Use Topics  . Alcohol use: No    No Known Allergies  Current Outpatient Medications  Medication Sig Dispense Refill  . amLODipine (NORVASC) 5 MG tablet     . levothyroxine (SYNTHROID, LEVOTHROID) 25 MCG tablet     . nortriptyline (PAMELOR) 25 MG capsule     . pravastatin (PRAVACHOL) 20 MG tablet     . traZODone (DESYREL) 50 MG tablet      No current facility-administered medications for this visit.     Review of Systems  Constitutional:  Constitutional negative. GI: Gastrointestinal negative.  Musculoskeletal: Positive for back pain.  Skin: Skin negative.        Objective:  Objective   Vitals:   04/22/17 1558  BP: (!) 171/91  Pulse: 67  Resp: 16  Temp: (!) 97.3 F (36.3 C)  TempSrc: Oral  SpO2: 100%  Weight: 209 lb (94.8 kg)  Height: 6\' 1"  (1.854 m)   Body mass index is 27.57 kg/m.  Physical Exam  Constitutional: He appears well-developed.  HENT:  Head: Normocephalic.  Eyes: Pupils are equal, round, and reactive to light.  Neck: Normal range of  motion.  Cardiovascular: Normal rate.  Pulses:      Femoral pulses are 2+ on the right side, and 2+ on the left side.      Popliteal pulses are 2+ on the right side, and 2+ on the left side.  Pulmonary/Chest: Effort normal.  Abdominal: Soft. He exhibits no mass.  Musculoskeletal: Normal range of motion. He exhibits no edema.  Skin: Skin is warm and dry.  Psychiatric: He has a normal mood and affect. His behavior is normal. Judgment and thought content normal.    Data: I have independently interpreted his CT scan which demonstrates 5.5 cm infrarenal aneurysm.  This is a Noncon scan but there appears to be sufficient neck for endovascular aneurysm repair.     Assessment/Plan:     84 year old male with 5.5 cm infrarenal aneurysm.  He is in good health to expect significant survival if not for his aneurysm.  I discussed with him the risk and benefits of proceeding with endovascular aneurysm repair and he is in agreement.  We will get him scheduled after cardiac clearance per     Waynetta Sandy MD [facility]

## 2017-04-26 ENCOUNTER — Encounter: Payer: Self-pay | Admitting: Vascular Surgery

## 2017-05-02 ENCOUNTER — Telehealth (HOSPITAL_COMMUNITY): Payer: Self-pay | Admitting: *Deleted

## 2017-05-02 ENCOUNTER — Ambulatory Visit (INDEPENDENT_AMBULATORY_CARE_PROVIDER_SITE_OTHER): Payer: Medicare Other | Admitting: Cardiology

## 2017-05-02 ENCOUNTER — Encounter: Payer: Self-pay | Admitting: Cardiology

## 2017-05-02 VITALS — BP 144/70 | HR 62 | Ht 73.0 in | Wt 210.1 lb

## 2017-05-02 DIAGNOSIS — E782 Mixed hyperlipidemia: Secondary | ICD-10-CM | POA: Insufficient documentation

## 2017-05-02 DIAGNOSIS — I714 Abdominal aortic aneurysm, without rupture, unspecified: Secondary | ICD-10-CM | POA: Insufficient documentation

## 2017-05-02 DIAGNOSIS — I10 Essential (primary) hypertension: Secondary | ICD-10-CM

## 2017-05-02 DIAGNOSIS — Z01810 Encounter for preprocedural cardiovascular examination: Secondary | ICD-10-CM

## 2017-05-02 DIAGNOSIS — Z87891 Personal history of nicotine dependence: Secondary | ICD-10-CM | POA: Diagnosis not present

## 2017-05-02 NOTE — Addendum Note (Signed)
Addended by: Mattie Marlin on: 05/02/2017 09:33 AM   Modules accepted: Orders

## 2017-05-02 NOTE — Progress Notes (Signed)
Cardiology Office Note:    Date:  05/02/2017   ID:  Anthony [place], DOB [birth year], MRN [id number]  PCP:  Anthony Ebbs, MD  Cardiologist:  Jenean Lindau, MD   Referring MD: Anthony Ebbs, MD    ASSESSMENT:    1. Essential hypertension, benign   2. Mixed dyslipidemia   3. AAA (abdominal aortic aneurysm) without rupture ([place])   4. Preoperative cardiovascular examination   5. Ex-smoker    PLAN:    In order of problems listed above:  1. Secondary prevention stressed with the patient.  Importance of compliance with diet and medications stressed and he vocalized understanding. 2. His blood pressure stable.  I would hesitate to initiate him on beta-blocker in view of his relative low heart rate.  I might try to consider this in the future. 3. Diet was discussed for dyslipidemia.  The patient does take aspirin on a regular basis. 4. In view of his multiple risk factors for coronary artery disease and a sedentary lifestyle he will undergo Lexiscan sestamibi testing.  If this is negative, he is noted high risk for coronary events during the aforementioned surgery.  Meticulous hemodynamic monitoring will further reduce the risk of coronary events. 5. Patient will be seen in follow-up appointment in 6 months or earlier if the patient has any concerns    Medication Adjustments/Labs and Tests Ordered: Current medicines are reviewed at length with the patient today.  Concerns regarding medicines are outlined above.  No orders of the defined types were placed in this encounter.  No orders of the defined types were placed in this encounter.    History of Present Illness:    Anthony Harmon is a 84 y.o. male who is being seen today for the evaluation of preop cardiovascular evaluation at the request of Anthony Ebbs, MD.  Patient is a pleasant 84 year old male.  He has past medical history of essential hypertension, dyslipidemia.  He mentions to me that he leads a sedentary lifestyle  because of orthopedic issues affecting his lower back.  As he was being evaluated for his back issues he was found to have abdominal aortic aneurysm for which he is getting ready for stent placement.  No abdominal pain no orthopnea or PND.  No chest pain.  He takes care of her activities of daily living.  He does not exercise on a regular basis.  He denies any history of syncope.  At the time of my evaluation, the patient is alert awake oriented and in no distress.  Past Medical History:  Diagnosis Date  . Arthritis   . High cholesterol   . Hypertension     Past Surgical History:  Procedure Laterality Date  . None      Current Medications: Current Meds  Medication Sig  . amLODipine (NORVASC) 5 MG tablet daily.   Marland Kitchen levothyroxine (SYNTHROID, LEVOTHROID) 25 MCG tablet Take by mouth daily before breakfast.   . levothyroxine (SYNTHROID, LEVOTHROID) 75 MCG tablet Take by mouth daily.   . nortriptyline (PAMELOR) 25 MG capsule   . pravastatin (PRAVACHOL) 20 MG tablet Take 20 mg by mouth daily.   . traZODone (DESYREL) 50 MG tablet Take 50 mg by mouth at bedtime as needed.      Allergies:   Patient has no known allergies.   Social History   Socioeconomic History  . Marital status: Married    Spouse name: None  . Number of children: None  . Years of education: None  . Highest education level:  None  Social Needs  . Financial resource strain: None  . Food insecurity - worry: None  . Food insecurity - inability: None  . Transportation needs - medical: None  . Transportation needs - non-medical: None  Occupational History  . None  Tobacco Use  . Smoking status: Former Smoker    Last attempt to quit: 04/23/2015    Years since quitting: 2.0  . Smokeless tobacco: Never Used  Substance and Sexual Activity  . Alcohol use: No  . Drug use: No  . Sexual activity: None  Other Topics Concern  . None  Social History Narrative   Married, 5 children   Right handed   10th grade   1 cup  daily     Family History: The patient's family history is not on file.  ROS:   Please see the history of present illness.    All other systems reviewed and are negative.  EKGs/Labs/Other Studies Reviewed:    The following studies were reviewed today: I reviewed his blood work and abdominal aneurysm report discussed it with him at length.  EKG done today reveals sinus rhythm and nonspecific ST-T changes.   Recent Labs: No results found for requested labs within last 8760 hours.  Recent Lipid Panel    Component Value Date/Time   LDLDIRECT 199 (H) 10/03/2009 2057    Physical Exam:    VS:  BP (!) 144/70 (BP Location: Right Arm, Patient Position: Sitting, Cuff Size: Normal)   Pulse 62   Ht 6\' 1"  (1.854 m)   Wt 210 lb 1.9 oz (95.3 kg)   SpO2 99%   BMI 27.72 kg/m     Wt Readings from Last 3 Encounters:  05/02/17 210 lb 1.9 oz (95.3 kg)  04/22/17 209 lb (94.8 kg)  03/30/17 210 lb (95.3 kg)     GEN: Patient is in no acute distress HEENT: Normal NECK: No JVD; No carotid bruits LYMPHATICS: No lymphadenopathy CARDIAC: S1 S2 regular, 2/6 systolic murmur at the apex. RESPIRATORY:  Clear to auscultation without rales, wheezing or rhonchi  ABDOMEN: Soft, non-tender, non-distended MUSCULOSKELETAL:  No edema; No deformity  SKIN: Warm and dry NEUROLOGIC:  Alert and oriented x 3 PSYCHIATRIC:  Normal affect    Signed, Jenean Lindau, MD  05/02/2017 9:26 AM    [facility]

## 2017-05-02 NOTE — Telephone Encounter (Signed)
Patient given detailed instructions per Myocardial Perfusion Study Information Sheet for the test on 05/03/17. Patient notified to arrive 15 minutes early and that it is imperative to arrive on time for appointment to keep from having the test rescheduled.  If you need to cancel or reschedule your appointment, please call the office within 24 hours of your appointment. . Patient verbalized understanding. Kirstie Peri

## 2017-05-02 NOTE — Patient Instructions (Addendum)
Medication Instructions:  Your physician recommends that you continue on your current medications as directed. Please refer to the Current Medication list given to you today.  Labwork: None  Testing/Procedures: Your physician has requested that you have a lexiscan myoview. For further information please visit [email or web address]. Please follow instruction sheet, as given.   Follow-Up: Your physician recommends that you schedule a follow-up appointment in: 6 months  Any Other Special Instructions Will Be Listed Below (If Applicable).     If you need a refill on your cardiac medications before your next appointment, please call your pharmacy.   [facility]  Ashley A, RN, BSN  

## 2017-05-03 ENCOUNTER — Ambulatory Visit (HOSPITAL_COMMUNITY): Payer: Medicare Other | Attending: Cardiology

## 2017-05-03 VITALS — Ht 73.0 in | Wt 210.0 lb

## 2017-05-03 DIAGNOSIS — Z01818 Encounter for other preprocedural examination: Secondary | ICD-10-CM

## 2017-05-03 DIAGNOSIS — R9439 Abnormal result of other cardiovascular function study: Secondary | ICD-10-CM | POA: Diagnosis not present

## 2017-05-03 DIAGNOSIS — I10 Essential (primary) hypertension: Secondary | ICD-10-CM

## 2017-05-03 DIAGNOSIS — Z01810 Encounter for preprocedural cardiovascular examination: Secondary | ICD-10-CM

## 2017-05-03 LAB — MYOCARDIAL PERFUSION IMAGING
CHL CUP NUCLEAR SDS: 6
LHR: 0.3
LV dias vol: 125 mL (ref 62–150)
LVSYSVOL: 63 mL
Peak HR: 88 {beats}/min
Rest HR: 56 {beats}/min
SRS: 12
SSS: 16
TID: 0.93

## 2017-05-03 MED ORDER — REGADENOSON 0.4 MG/5ML IV SOLN
0.4000 mg | Freq: Once | INTRAVENOUS | Status: AC
  Administered 2017-05-03: 0.4 mg via INTRAVENOUS

## 2017-05-03 MED ORDER — TECHNETIUM TC 99M TETROFOSMIN IV KIT
10.2000 | PACK | Freq: Once | INTRAVENOUS | Status: AC | PRN
  Administered 2017-05-03: 10.2 via INTRAVENOUS
  Filled 2017-05-03: qty 11

## 2017-05-03 MED ORDER — TECHNETIUM TC 99M TETROFOSMIN IV KIT
33.0000 | PACK | Freq: Once | INTRAVENOUS | Status: AC | PRN
  Administered 2017-05-03: 33 via INTRAVENOUS
  Filled 2017-05-03: qty 33

## 2017-05-09 NOTE — Pre-Procedure Instructions (Signed)
Anthony Harmon  05/09/2017      [facility] ([place]), [place] [address]  ([place]) [place] [address] Phone: [phone] Fax: [phone]    Your procedure is scheduled on May 12, 2017.  Report to [facility] Admitting at 830 AM.  Call this number if you have problems the morning of surgery:  [phone]   Remember:  Do not eat food or drink liquids after midnight.  Take these medicines the morning of surgery with A SIP OF WATER amlodipine (norvasc), levothyroxine (synthroid)  Follow your surgeon's instructions regarding your aspirin.  Beginning now, STOP Aleve, Naproxen, Ibuprofen, Motrin, Advil, Goody's, BC's, all herbal medications, fish oil, and all vitamins  Continue all other medications as instructed by your physician except follow the above medication instructions before surgery   Do not wear jewelry.  Do not wear lotions, powders, or colognes, or deodorant.  Men may shave face and neck.  Do not bring valuables to the hospital.  [facility] is not responsible for any belongings or valuables.  Contacts, dentures or bridgework may not be worn into surgery.  Leave your suitcase in the car.  After surgery it may be brought to your room.  For patients admitted to the hospital, discharge time will be determined by your treatment team.  Patients discharged the day of surgery will not be allowed to drive home.    [name]- Preparing For Surgery  Before surgery, you can play an important role. Because skin is not sterile, your skin needs to be as free of germs as possible. You can reduce the number of germs on your skin by washing with CHG (chlorahexidine gluconate) Soap before surgery.  CHG is an antiseptic cleaner which kills germs and bonds with the skin to continue killing germs even after washing.  Please do not use if you have an allergy to CHG or antibacterial soaps. If your skin becomes  reddened/irritated stop using the CHG.  Do not shave (including legs and underarms) for at least 48 hours prior to first CHG shower. It is OK to shave your face.  Please follow these instructions carefully.   1. Shower the NIGHT BEFORE SURGERY and the MORNING OF SURGERY with CHG.   2. If you chose to wash your hair, wash your hair first as usual with your normal shampoo.  3. After you shampoo, rinse your hair and body thoroughly to remove the shampoo.  4. Use CHG as you would any other liquid soap. You can apply CHG directly to the skin and wash gently with a scrungie or a clean washcloth.   5. Apply the CHG Soap to your body ONLY FROM THE NECK DOWN.  Do not use on open wounds or open sores. Avoid contact with your eyes, ears, mouth and genitals (private parts). Wash Face and genitals (private parts)  with your normal soap.  6. Wash thoroughly, paying special attention to the area where your surgery will be performed.  7. Thoroughly rinse your body with warm water from the neck down.  8. DO NOT shower/wash with your normal soap after using and rinsing off the CHG Soap.  9. Pat yourself dry with a CLEAN TOWEL.  10. Wear CLEAN PAJAMAS to bed the night before surgery, wear comfortable clothes the morning of surgery  11. Place CLEAN SHEETS on your bed the night of your first shower and DO NOT SLEEP WITH PETS.  Day of Surgery: Do not  apply any deodorants/lotions. Please wear clean clothes to the hospital/surgery center.    Please read over the following fact sheets that you were given. Pain Booklet, Coughing and Deep Breathing, MRSA Information and Surgical Site Infection Prevention

## 2017-05-10 ENCOUNTER — Other Ambulatory Visit: Payer: Self-pay

## 2017-05-10 ENCOUNTER — Encounter (HOSPITAL_COMMUNITY): Payer: Self-pay

## 2017-05-10 ENCOUNTER — Encounter (HOSPITAL_COMMUNITY)
Admission: RE | Admit: 2017-05-10 | Discharge: 2017-05-10 | Disposition: A | Discharge status: Home / Self Care | Payer: Medicare Other | Source: Ambulatory Visit | Attending: Vascular Surgery | Admitting: Vascular Surgery

## 2017-05-10 DIAGNOSIS — Z7982 Long term (current) use of aspirin: Secondary | ICD-10-CM | POA: Insufficient documentation

## 2017-05-10 DIAGNOSIS — E039 Hypothyroidism, unspecified: Secondary | ICD-10-CM | POA: Diagnosis not present

## 2017-05-10 DIAGNOSIS — E0590 Thyrotoxicosis, unspecified without thyrotoxic crisis or storm: Secondary | ICD-10-CM

## 2017-05-10 DIAGNOSIS — N183 Chronic kidney disease, stage 3 (moderate): Secondary | ICD-10-CM

## 2017-05-10 DIAGNOSIS — K219 Gastro-esophageal reflux disease without esophagitis: Secondary | ICD-10-CM

## 2017-05-10 DIAGNOSIS — Z01812 Encounter for preprocedural laboratory examination: Secondary | ICD-10-CM | POA: Insufficient documentation

## 2017-05-10 DIAGNOSIS — I252 Old myocardial infarction: Secondary | ICD-10-CM | POA: Diagnosis not present

## 2017-05-10 DIAGNOSIS — I714 Abdominal aortic aneurysm, without rupture: Secondary | ICD-10-CM

## 2017-05-10 DIAGNOSIS — Z87891 Personal history of nicotine dependence: Secondary | ICD-10-CM

## 2017-05-10 DIAGNOSIS — I739 Peripheral vascular disease, unspecified: Secondary | ICD-10-CM | POA: Diagnosis not present

## 2017-05-10 DIAGNOSIS — E7800 Pure hypercholesterolemia, unspecified: Secondary | ICD-10-CM

## 2017-05-10 DIAGNOSIS — M1990 Unspecified osteoarthritis, unspecified site: Secondary | ICD-10-CM

## 2017-05-10 DIAGNOSIS — I10 Essential (primary) hypertension: Secondary | ICD-10-CM | POA: Insufficient documentation

## 2017-05-10 DIAGNOSIS — I129 Hypertensive chronic kidney disease with stage 1 through stage 4 chronic kidney disease, or unspecified chronic kidney disease: Secondary | ICD-10-CM | POA: Diagnosis not present

## 2017-05-10 DIAGNOSIS — Z79899 Other long term (current) drug therapy: Secondary | ICD-10-CM

## 2017-05-10 DIAGNOSIS — Z8546 Personal history of malignant neoplasm of prostate: Secondary | ICD-10-CM

## 2017-05-10 HISTORY — DX: Abdominal aortic aneurysm, without rupture: I71.4

## 2017-05-10 HISTORY — DX: Gastro-esophageal reflux disease without esophagitis: K21.9

## 2017-05-10 HISTORY — DX: Malignant neoplasm of prostate: C61

## 2017-05-10 HISTORY — DX: Abdominal aortic aneurysm, without rupture, unspecified: I71.40

## 2017-05-10 HISTORY — DX: Chronic kidney disease, unspecified: N18.9

## 2017-05-10 HISTORY — DX: Hypothyroidism, unspecified: E03.9

## 2017-05-10 LAB — BLOOD GAS, ARTERIAL
ACID-BASE DEFICIT: 1.2 mmol/L (ref 0.0–2.0)
Bicarbonate: 22.6 mmol/L (ref 20.0–28.0)
DRAWN BY: 470591
FIO2: 21
O2 Saturation: 99.1 %
PH ART: 7.427 (ref 7.350–7.450)
Patient temperature: 98.6
pCO2 arterial: 34.9 mmHg (ref 32.0–48.0)
pO2, Arterial: 150 mmHg — ABNORMAL HIGH (ref 83.0–108.0)

## 2017-05-10 LAB — COMPREHENSIVE METABOLIC PANEL
ALT: 26 U/L (ref 17–63)
AST: 31 U/L (ref 15–41)
Albumin: 4 g/dL (ref 3.5–5.0)
Alkaline Phosphatase: 50 U/L (ref 38–126)
Anion gap: 11 (ref 5–15)
BUN: 26 mg/dL — AB (ref 6–20)
CHLORIDE: 108 mmol/L (ref 101–111)
CO2: 19 mmol/L — AB (ref 22–32)
CREATININE: 1.85 mg/dL — AB (ref 0.61–1.24)
Calcium: 9.8 mg/dL (ref 8.9–10.3)
GFR calc Af Amer: 37 mL/min — ABNORMAL LOW (ref >60)
GFR, EST NON AFRICAN AMERICAN: 32 mL/min — AB (ref >60)
Glucose, Bld: 91 mg/dL (ref 65–99)
Potassium: 4.2 mmol/L (ref 3.5–5.1)
SODIUM: 138 mmol/L (ref 135–145)
Total Bilirubin: 0.7 mg/dL (ref 0.3–1.2)
Total Protein: 7.3 g/dL (ref 6.5–8.1)

## 2017-05-10 LAB — URINALYSIS, ROUTINE W REFLEX MICROSCOPIC
Bilirubin Urine: NEGATIVE
Glucose, UA: NEGATIVE mg/dL
Hgb urine dipstick: NEGATIVE
KETONES UR: NEGATIVE mg/dL
LEUKOCYTES UA: NEGATIVE
NITRITE: NEGATIVE
PROTEIN: NEGATIVE mg/dL
Specific Gravity, Urine: 1.014 (ref 1.005–1.030)
pH: 5 (ref 5.0–8.0)

## 2017-05-10 LAB — CBC
HCT: 37.7 % — ABNORMAL LOW (ref 39.0–52.0)
Hemoglobin: 12.9 g/dL — ABNORMAL LOW (ref 13.0–17.0)
MCH: 31.2 pg (ref 26.0–34.0)
MCHC: 34.2 g/dL (ref 30.0–36.0)
MCV: 91.1 fL (ref 78.0–100.0)
PLATELETS: 187 10*3/uL (ref 150–400)
RBC: 4.14 MIL/uL — AB (ref 4.22–5.81)
RDW: 12.8 % (ref 11.5–15.5)
WBC: 7.2 10*3/uL (ref 4.0–10.5)

## 2017-05-10 LAB — SURGICAL PCR SCREEN
MRSA, PCR: NEGATIVE
Staphylococcus aureus: NEGATIVE

## 2017-05-10 LAB — TYPE AND SCREEN
ABO/RH(D): O POS
Antibody Screen: NEGATIVE

## 2017-05-10 LAB — PROTIME-INR
INR: 1.07
Prothrombin Time: 13.8 seconds (ref 11.4–15.2)

## 2017-05-10 LAB — ABO/RH: ABO/RH(D): O POS

## 2017-05-10 LAB — APTT: aPTT: 31 seconds (ref 24–36)

## 2017-05-10 NOTE — Progress Notes (Addendum)
Pt. Saw Dr. Salley Scarlet  For cardiac clearance,pt. States he had no prior history of heart problems.  Denies any chest pain or discomfort   Pt. States he was told to continue ASA.

## 2017-05-11 ENCOUNTER — Encounter (HOSPITAL_COMMUNITY): Payer: Self-pay

## 2017-05-11 NOTE — Progress Notes (Signed)
Anesthesia Chart Review: Patient is an 84 year old male scheduled for EVAR AAA on 05/12/17 by Dr. Servando Snare.  History includes former smoker (quit '17), HTN, AAA, CKD stage III, hypercholesterolemia, GERD, prostate cancer (brachytherapy 08/16/07), arthritis, hypothyroidism. No CAD history reported, but 05/03/17 stress test showed findings consistent with prior MI (inferior scar).  - PCP is Dr. Nolene Ebbs. His 03/10/17 office note is scanned under Media tab. - He was referred to cardiologist Dr. Jyl Heinz for preoperative evaluation (see 05/02/17 note). Stress test showed findings consistent with prior MI, but no further testing ordered following recent stress test.   Meds include amlodipine, ASA 81 mg, levothyroxine, pravastatin, trazodone. He reports that he is to continue ASA.  BP (!) 176/77   Pulse (!) 55   Temp 36.6 C   Resp 18   Ht 6\' 2"  (1.88 m)   Wt 209 lb 3.2 oz (94.9 kg)   SpO2 99%   BMI 26.86 kg/m   EKG 05/02/17: NSR, non-specific T wave abnormality.  Nuclear stress test 05/03/17:  Nuclear stress EF: 50%. The left ventricular ejection fraction is mildly decreased (45-54%).  Defect 1: There is a large defect of moderate severity present in the basal inferior, mid inferior, mid inferolateral, apical anterior region and a 2nd defect in the apical inferior, apical lateral and apex location.  Findings consistent with prior myocardial infarction.  This is an intermediate risk study.  CT abd/pelvis 04/22/17: IMPRESSION: - 5.9 x 5.2 cm infrarenal abdominal aortic aneurysm is noted which extends to the aortic bifurcation. - Status post prostatic brachytherapy seed placement. Multiple small sclerotic densities are noted throughout the pelvis which may represent benign enostoses, but metastatic disease cannot be excluded given the history of prostate cancer. Comparison to prior studies if available would be helpful, as well as correlation with PSA levels.  Preoperative labs  noted. H/H 12.9/37.7. PLT 187K. PT/PTT WNL. Glucose 91. T&S done. BUN/Cr 26/1.85. According to radiology records (scanned under Media tab), patient's labs on 04/22/17 showed BUN 35, Cr 2.1, GFR 33. Labs from 08/09/07 show a Cr or 1.69 on 08/09/07 and 1.48 on 10/03/09.    I reviewed stress test results and Dr. Julien Nordmann input with anesthesiologist Dr. Lillia Abed. Patient's renal function appears overall stable. If no acute changes then I anticipate that he can proceed as planned.  George Hugh [facility] Stay Center/Anesthesiology Phone [phone] 05/11/2017 9:54 AM

## 2017-05-12 ENCOUNTER — Inpatient Hospital Stay (HOSPITAL_COMMUNITY): Payer: Medicare Other

## 2017-05-12 ENCOUNTER — Encounter (HOSPITAL_COMMUNITY)
Admission: RE | Disposition: A | Discharge status: Home / Self Care | Payer: Self-pay | Source: Ambulatory Visit | Attending: Vascular Surgery

## 2017-05-12 ENCOUNTER — Encounter (HOSPITAL_COMMUNITY): Payer: Self-pay | Admitting: *Deleted

## 2017-05-12 ENCOUNTER — Inpatient Hospital Stay (HOSPITAL_COMMUNITY): Payer: Medicare Other | Admitting: Vascular Surgery

## 2017-05-12 ENCOUNTER — Inpatient Hospital Stay (HOSPITAL_COMMUNITY): Payer: Medicare Other | Admitting: Certified Registered"

## 2017-05-12 ENCOUNTER — Inpatient Hospital Stay (HOSPITAL_COMMUNITY)
Admission: RE | Admit: 2017-05-12 | Discharge: 2017-05-13 | DRG: 269 | Disposition: A | Discharge status: Home / Self Care | Payer: Medicare Other | Source: Ambulatory Visit | Attending: Vascular Surgery | Admitting: Vascular Surgery

## 2017-05-12 DIAGNOSIS — E7800 Pure hypercholesterolemia, unspecified: Secondary | ICD-10-CM | POA: Diagnosis present

## 2017-05-12 DIAGNOSIS — Z8546 Personal history of malignant neoplasm of prostate: Secondary | ICD-10-CM | POA: Diagnosis not present

## 2017-05-12 DIAGNOSIS — E782 Mixed hyperlipidemia: Secondary | ICD-10-CM | POA: Diagnosis not present

## 2017-05-12 DIAGNOSIS — N183 Chronic kidney disease, stage 3 (moderate): Secondary | ICD-10-CM | POA: Diagnosis not present

## 2017-05-12 DIAGNOSIS — M1990 Unspecified osteoarthritis, unspecified site: Secondary | ICD-10-CM | POA: Diagnosis present

## 2017-05-12 DIAGNOSIS — Z79899 Other long term (current) drug therapy: Secondary | ICD-10-CM | POA: Diagnosis not present

## 2017-05-12 DIAGNOSIS — K219 Gastro-esophageal reflux disease without esophagitis: Secondary | ICD-10-CM | POA: Diagnosis not present

## 2017-05-12 DIAGNOSIS — Z8679 Personal history of other diseases of the circulatory system: Secondary | ICD-10-CM

## 2017-05-12 DIAGNOSIS — I129 Hypertensive chronic kidney disease with stage 1 through stage 4 chronic kidney disease, or unspecified chronic kidney disease: Secondary | ICD-10-CM | POA: Diagnosis not present

## 2017-05-12 DIAGNOSIS — Z9889 Other specified postprocedural states: Secondary | ICD-10-CM

## 2017-05-12 DIAGNOSIS — I252 Old myocardial infarction: Secondary | ICD-10-CM | POA: Diagnosis not present

## 2017-05-12 DIAGNOSIS — I714 Abdominal aortic aneurysm, without rupture: Secondary | ICD-10-CM

## 2017-05-12 DIAGNOSIS — J9811 Atelectasis: Secondary | ICD-10-CM | POA: Diagnosis not present

## 2017-05-12 DIAGNOSIS — Z87891 Personal history of nicotine dependence: Secondary | ICD-10-CM | POA: Diagnosis not present

## 2017-05-12 DIAGNOSIS — I739 Peripheral vascular disease, unspecified: Secondary | ICD-10-CM | POA: Diagnosis not present

## 2017-05-12 DIAGNOSIS — N189 Chronic kidney disease, unspecified: Secondary | ICD-10-CM | POA: Diagnosis not present

## 2017-05-12 DIAGNOSIS — E039 Hypothyroidism, unspecified: Secondary | ICD-10-CM | POA: Diagnosis present

## 2017-05-12 HISTORY — PX: ABDOMINAL AORTIC ENDOVASCULAR STENT GRAFT: SHX5707

## 2017-05-12 LAB — CBC
HCT: 34.3 % — ABNORMAL LOW (ref 39.0–52.0)
Hemoglobin: 11.4 g/dL — ABNORMAL LOW (ref 13.0–17.0)
MCH: 30.5 pg (ref 26.0–34.0)
MCHC: 33.2 g/dL (ref 30.0–36.0)
MCV: 91.7 fL (ref 78.0–100.0)
PLATELETS: 181 10*3/uL (ref 150–400)
RBC: 3.74 MIL/uL — ABNORMAL LOW (ref 4.22–5.81)
RDW: 12.8 % (ref 11.5–15.5)
WBC: 7.7 10*3/uL (ref 4.0–10.5)

## 2017-05-12 LAB — BASIC METABOLIC PANEL
ANION GAP: 7 (ref 5–15)
BUN: 21 mg/dL — ABNORMAL HIGH (ref 6–20)
CALCIUM: 9.2 mg/dL (ref 8.9–10.3)
CO2: 24 mmol/L (ref 22–32)
Chloride: 108 mmol/L (ref 101–111)
Creatinine, Ser: 1.86 mg/dL — ABNORMAL HIGH (ref 0.61–1.24)
GFR calc non Af Amer: 32 mL/min — ABNORMAL LOW (ref >60)
GFR, EST AFRICAN AMERICAN: 37 mL/min — AB (ref >60)
Glucose, Bld: 103 mg/dL — ABNORMAL HIGH (ref 65–99)
Potassium: 3.9 mmol/L (ref 3.5–5.1)
Sodium: 139 mmol/L (ref 135–145)

## 2017-05-12 LAB — MAGNESIUM: Magnesium: 1.5 mg/dL — ABNORMAL LOW (ref 1.7–2.4)

## 2017-05-12 LAB — PROTIME-INR
INR: 1.21
Prothrombin Time: 15.2 seconds (ref 11.4–15.2)

## 2017-05-12 LAB — APTT: APTT: 34 s (ref 24–36)

## 2017-05-12 SURGERY — INSERTION, ENDOVASCULAR STENT GRAFT, AORTA, ABDOMINAL
Anesthesia: General | Site: Abdomen

## 2017-05-12 MED ORDER — PROPOFOL 10 MG/ML IV BOLUS
INTRAVENOUS | Status: DC | PRN
  Administered 2017-05-12: 50 mg via INTRAVENOUS
  Administered 2017-05-12: 90 mg via INTRAVENOUS
  Administered 2017-05-12: 20 mg via INTRAVENOUS

## 2017-05-12 MED ORDER — LACTATED RINGERS IV SOLN
INTRAVENOUS | Status: DC | PRN
  Administered 2017-05-12: 07:00:00 via INTRAVENOUS

## 2017-05-12 MED ORDER — CEFAZOLIN SODIUM-DEXTROSE 2-4 GM/100ML-% IV SOLN
2.0000 g | Freq: Three times a day (TID) | INTRAVENOUS | Status: AC
  Administered 2017-05-12 – 2017-05-13 (×2): 2 g via INTRAVENOUS
  Filled 2017-05-12 (×3): qty 100

## 2017-05-12 MED ORDER — SUGAMMADEX SODIUM 200 MG/2ML IV SOLN
INTRAVENOUS | Status: DC | PRN
  Administered 2017-05-12: 200 mg via INTRAVENOUS

## 2017-05-12 MED ORDER — LABETALOL HCL 5 MG/ML IV SOLN: 10.0000 mg | INTRAVENOUS | Status: DC | PRN

## 2017-05-12 MED ORDER — ACETAMINOPHEN 325 MG PO TABS: 325.0000 mg | ORAL_TABLET | ORAL | Status: DC | PRN

## 2017-05-12 MED ORDER — DEXAMETHASONE SODIUM PHOSPHATE 10 MG/ML IJ SOLN
INTRAMUSCULAR | Status: AC
  Filled 2017-05-12: qty 1

## 2017-05-12 MED ORDER — MAGNESIUM SULFATE 2 GM/50ML IV SOLN: 2.0000 g | Freq: Every day | INTRAVENOUS | Status: DC | PRN

## 2017-05-12 MED ORDER — LEVOTHYROXINE SODIUM 75 MCG PO TABS
75.0000 ug | ORAL_TABLET | Freq: Every day | ORAL | Status: DC
  Administered 2017-05-13: 75 ug via ORAL
  Filled 2017-05-12: qty 1

## 2017-05-12 MED ORDER — ROCURONIUM BROMIDE 10 MG/ML (PF) SYRINGE
PREFILLED_SYRINGE | INTRAVENOUS | Status: DC | PRN
  Administered 2017-05-12: 10 mg via INTRAVENOUS
  Administered 2017-05-12: 60 mg via INTRAVENOUS

## 2017-05-12 MED ORDER — PROPOFOL 10 MG/ML IV BOLUS
INTRAVENOUS | Status: AC
  Filled 2017-05-12: qty 40

## 2017-05-12 MED ORDER — SODIUM CHLORIDE 0.9 % IV SOLN: INTRAVENOUS | Status: DC

## 2017-05-12 MED ORDER — OXYCODONE HCL 5 MG PO TABS
5.0000 mg | ORAL_TABLET | Freq: Once | ORAL | Status: AC | PRN
  Administered 2017-05-12: 5 mg via ORAL

## 2017-05-12 MED ORDER — SODIUM CHLORIDE 0.9 % IV SOLN
INTRAVENOUS | Status: DC | PRN
  Administered 2017-05-12: 07:00:00

## 2017-05-12 MED ORDER — ROCURONIUM BROMIDE 10 MG/ML (PF) SYRINGE
PREFILLED_SYRINGE | INTRAVENOUS | Status: AC
  Filled 2017-05-12: qty 10

## 2017-05-12 MED ORDER — DEXAMETHASONE SODIUM PHOSPHATE 10 MG/ML IJ SOLN
INTRAMUSCULAR | Status: DC | PRN
  Administered 2017-05-12: 10 mg via INTRAVENOUS

## 2017-05-12 MED ORDER — FENTANYL CITRATE (PF) 100 MCG/2ML IJ SOLN
INTRAMUSCULAR | Status: AC
  Filled 2017-05-12: qty 2

## 2017-05-12 MED ORDER — ASPIRIN EC 81 MG PO TBEC
81.0000 mg | DELAYED_RELEASE_TABLET | Freq: Every day | ORAL | Status: DC
  Administered 2017-05-12 – 2017-05-13 (×2): 81 mg via ORAL
  Filled 2017-05-12 (×2): qty 1

## 2017-05-12 MED ORDER — FENTANYL CITRATE (PF) 250 MCG/5ML IJ SOLN
INTRAMUSCULAR | Status: AC
Start: 2017-05-12 — End: ?
  Filled 2017-05-12: qty 10

## 2017-05-12 MED ORDER — HEPARIN SODIUM (PORCINE) 1000 UNIT/ML IJ SOLN
INTRAMUSCULAR | Status: DC | PRN
  Administered 2017-05-12: 10000 [IU] via INTRAVENOUS

## 2017-05-12 MED ORDER — ONDANSETRON HCL 4 MG/2ML IJ SOLN
INTRAMUSCULAR | Status: AC
  Filled 2017-05-12: qty 4

## 2017-05-12 MED ORDER — HYDRALAZINE HCL 20 MG/ML IJ SOLN
INTRAMUSCULAR | Status: AC
  Filled 2017-05-12: qty 1

## 2017-05-12 MED ORDER — POTASSIUM CHLORIDE CRYS ER 20 MEQ PO TBCR: 20.0000 meq | EXTENDED_RELEASE_TABLET | Freq: Every day | ORAL | Status: DC | PRN

## 2017-05-12 MED ORDER — HYDRALAZINE HCL 20 MG/ML IJ SOLN
5.0000 mg | INTRAMUSCULAR | Status: AC | PRN
  Administered 2017-05-12: 5 mg via INTRAVENOUS
  Administered 2017-05-12: 10:00:00 via INTRAVENOUS

## 2017-05-12 MED ORDER — MORPHINE SULFATE (PF) 4 MG/ML IV SOLN
4.0000 mg | INTRAVENOUS | Status: DC | PRN
  Administered 2017-05-12: 4 mg via INTRAVENOUS

## 2017-05-12 MED ORDER — FENTANYL CITRATE (PF) 100 MCG/2ML IJ SOLN
25.0000 ug | INTRAMUSCULAR | Status: DC | PRN
  Administered 2017-05-12 (×2): 50 ug via INTRAVENOUS

## 2017-05-12 MED ORDER — OXYCODONE HCL 5 MG/5ML PO SOLN: 5.0000 mg | Freq: Once | ORAL | Status: AC | PRN

## 2017-05-12 MED ORDER — TRAZODONE HCL 50 MG PO TABS: 50.0000 mg | ORAL_TABLET | Freq: Every evening | ORAL | Status: DC | PRN

## 2017-05-12 MED ORDER — LIDOCAINE 2% (20 MG/ML) 5 ML SYRINGE
INTRAMUSCULAR | Status: DC | PRN
  Administered 2017-05-12: 80 mg via INTRAVENOUS

## 2017-05-12 MED ORDER — CEFAZOLIN SODIUM-DEXTROSE 2-4 GM/100ML-% IV SOLN
INTRAVENOUS | Status: AC
  Filled 2017-05-12: qty 100

## 2017-05-12 MED ORDER — ALUM & MAG HYDROXIDE-SIMETH 200-200-20 MG/5ML PO SUSP: 15.0000 mL | ORAL | Status: DC | PRN

## 2017-05-12 MED ORDER — MORPHINE SULFATE (PF) 4 MG/ML IV SOLN
INTRAVENOUS | Status: AC
  Administered 2017-05-12: 4 mg via INTRAVENOUS
  Filled 2017-05-12: qty 1

## 2017-05-12 MED ORDER — PROTAMINE SULFATE 10 MG/ML IV SOLN
INTRAVENOUS | Status: DC | PRN
  Administered 2017-05-12: 15 mg via INTRAVENOUS
  Administered 2017-05-12: 20 mg via INTRAVENOUS
  Administered 2017-05-12: 15 mg via INTRAVENOUS

## 2017-05-12 MED ORDER — OXYCODONE HCL 5 MG PO TABS
ORAL_TABLET | ORAL | Status: AC
  Filled 2017-05-12: qty 1

## 2017-05-12 MED ORDER — HEPARIN SODIUM (PORCINE) 5000 UNIT/ML IJ SOLN
5000.0000 [IU] | Freq: Two times a day (BID) | INTRAMUSCULAR | Status: DC
  Administered 2017-05-12 – 2017-05-13 (×2): 5000 [IU] via SUBCUTANEOUS
  Filled 2017-05-12 (×3): qty 1

## 2017-05-12 MED ORDER — CHLORHEXIDINE GLUCONATE CLOTH 2 % EX PADS: 6.0000 | MEDICATED_PAD | Freq: Once | CUTANEOUS | Status: DC

## 2017-05-12 MED ORDER — FENTANYL CITRATE (PF) 100 MCG/2ML IJ SOLN
INTRAMUSCULAR | Status: DC | PRN
  Administered 2017-05-12: 25 ug via INTRAVENOUS
  Administered 2017-05-12: 50 ug via INTRAVENOUS

## 2017-05-12 MED ORDER — POLYETHYLENE GLYCOL 3350 17 G PO PACK: 17.0000 g | PACK | Freq: Every day | ORAL | Status: DC | PRN

## 2017-05-12 MED ORDER — CEFAZOLIN SODIUM-DEXTROSE 2-4 GM/100ML-% IV SOLN
2.0000 g | INTRAVENOUS | Status: AC
  Administered 2017-05-12: 2 g via INTRAVENOUS

## 2017-05-12 MED ORDER — METOPROLOL TARTRATE 5 MG/5ML IV SOLN: 2.0000 mg | INTRAVENOUS | Status: DC | PRN

## 2017-05-12 MED ORDER — SODIUM CHLORIDE 0.9 % IV SOLN: 500.0000 mL | Freq: Once | INTRAVENOUS | Status: DC | PRN

## 2017-05-12 MED ORDER — HEPARIN SODIUM (PORCINE) 1000 UNIT/ML IJ SOLN
INTRAMUSCULAR | Status: AC
  Filled 2017-05-12: qty 1

## 2017-05-12 MED ORDER — AMLODIPINE BESYLATE 5 MG PO TABS
5.0000 mg | ORAL_TABLET | Freq: Every day | ORAL | Status: DC
  Administered 2017-05-12 – 2017-05-13 (×2): 5 mg via ORAL
  Filled 2017-05-12 (×2): qty 1

## 2017-05-12 MED ORDER — PROTAMINE SULFATE 10 MG/ML IV SOLN
INTRAVENOUS | Status: AC
  Filled 2017-05-12: qty 5

## 2017-05-12 MED ORDER — PRAVASTATIN SODIUM 20 MG PO TABS
20.0000 mg | ORAL_TABLET | Freq: Every day | ORAL | Status: DC
  Administered 2017-05-12 – 2017-05-13 (×2): 20 mg via ORAL
  Filled 2017-05-12 (×2): qty 1

## 2017-05-12 MED ORDER — ACETAMINOPHEN 325 MG RE SUPP: 325.0000 mg | RECTAL | Status: DC | PRN

## 2017-05-12 MED ORDER — ONDANSETRON HCL 4 MG/2ML IJ SOLN: 4.0000 mg | Freq: Four times a day (QID) | INTRAMUSCULAR | Status: DC | PRN

## 2017-05-12 MED ORDER — PANTOPRAZOLE SODIUM 40 MG PO TBEC
40.0000 mg | DELAYED_RELEASE_TABLET | Freq: Every day | ORAL | Status: DC
  Administered 2017-05-12 – 2017-05-13 (×2): 40 mg via ORAL
  Filled 2017-05-12 (×2): qty 1

## 2017-05-12 MED ORDER — 0.9 % SODIUM CHLORIDE (POUR BTL) OPTIME
TOPICAL | Status: DC | PRN
  Administered 2017-05-12: 1000 mL

## 2017-05-12 MED ORDER — OXYCODONE-ACETAMINOPHEN 5-325 MG PO TABS: 1.0000 | ORAL_TABLET | ORAL | Status: DC | PRN

## 2017-05-12 MED ORDER — DOCUSATE SODIUM 100 MG PO CAPS
100.0000 mg | ORAL_CAPSULE | Freq: Every day | ORAL | Status: DC
  Administered 2017-05-13: 100 mg via ORAL
  Filled 2017-05-12: qty 1

## 2017-05-12 MED ORDER — PHENOL 1.4 % MT LIQD: 1.0000 | OROMUCOSAL | Status: DC | PRN

## 2017-05-12 MED ORDER — IODIXANOL 320 MG/ML IV SOLN
INTRAVENOUS | Status: DC | PRN
  Administered 2017-05-12: 25.2 mL via INTRAVENOUS

## 2017-05-12 MED ORDER — HEPARIN SODIUM (PORCINE) 5000 UNIT/ML IJ SOLN
INTRAMUSCULAR | Status: AC
  Filled 2017-05-12: qty 1.2

## 2017-05-12 MED ORDER — GUAIFENESIN-DM 100-10 MG/5ML PO SYRP: 15.0000 mL | ORAL_SOLUTION | ORAL | Status: DC | PRN

## 2017-05-12 MED ORDER — ONDANSETRON HCL 4 MG/2ML IJ SOLN
INTRAMUSCULAR | Status: DC | PRN
  Administered 2017-05-12: 4 mg via INTRAVENOUS

## 2017-05-12 SURGICAL SUPPLY — 73 items
BLADE CLIPPER SURG (BLADE) ×3 IMPLANT
CANISTER SUCT 3000ML PPV (MISCELLANEOUS) ×3 IMPLANT
CATH ANGIO 5F BER2 65CM (CATHETERS) IMPLANT
CATH BEACON 5 .035 65 KMP TIP (CATHETERS) ×3 IMPLANT
CATH BEACON 5.038 65CM KMP-01 (CATHETERS) IMPLANT
CATH OMNI FLUSH .035X70CM (CATHETERS) ×3 IMPLANT
CATH SOFT-VU 4F 65 STRAIGHT (CATHETERS) ×1 IMPLANT
CATH SOFT-VU STRAIGHT 4F 65CM (CATHETERS) ×2
COVER PROBE W GEL 5X96 (DRAPES) ×3 IMPLANT
DERMABOND ADHESIVE PROPEN (GAUZE/BANDAGES/DRESSINGS) ×4
DERMABOND ADVANCED (GAUZE/BANDAGES/DRESSINGS) ×2
DERMABOND ADVANCED .7 DNX12 (GAUZE/BANDAGES/DRESSINGS) ×1 IMPLANT
DERMABOND ADVANCED .7 DNX6 (GAUZE/BANDAGES/DRESSINGS) ×2 IMPLANT
DEVICE CLOSURE PERCLS PRGLD 6F (VASCULAR PRODUCTS) ×4 IMPLANT
DRAPE ZERO GRAVITY STERILE (DRAPES) ×3 IMPLANT
DRESSING OPSITE X SMALL 2X3 (GAUZE/BANDAGES/DRESSINGS) ×6 IMPLANT
DRSG TEGADERM 2-3/8X2-3/4 SM (GAUZE/BANDAGES/DRESSINGS) ×6 IMPLANT
DRYSEAL FLEXSHEATH 12FR 33CM (SHEATH) ×2
DRYSEAL FLEXSHEATH 16FR 33CM (SHEATH) ×2
ELECT REM PT RETURN 9FT ADLT (ELECTROSURGICAL) ×6
ELECTRODE REM PT RTRN 9FT ADLT (ELECTROSURGICAL) ×2 IMPLANT
EXCLUDER TNK LEG 26MX14X14 (Endovascular Graft) ×1 IMPLANT
EXCLUDER TRUNK LEG 26MX14X14 (Endovascular Graft) ×3 IMPLANT
GAUZE SPONGE 2X2 8PLY STRL LF (GAUZE/BANDAGES/DRESSINGS) ×2 IMPLANT
GLOVE BIO SURGEON STRL SZ 6.5 (GLOVE) ×2 IMPLANT
GLOVE BIO SURGEON STRL SZ7 (GLOVE) ×6 IMPLANT
GLOVE BIO SURGEON STRL SZ7.5 (GLOVE) ×3 IMPLANT
GLOVE BIO SURGEONS STRL SZ 6.5 (GLOVE) ×1
GLOVE BIOGEL PI IND STRL 6.5 (GLOVE) ×2 IMPLANT
GLOVE BIOGEL PI IND STRL 7.0 (GLOVE) ×3 IMPLANT
GLOVE BIOGEL PI IND STRL 7.5 (GLOVE) ×1 IMPLANT
GLOVE BIOGEL PI INDICATOR 6.5 (GLOVE) ×4
GLOVE BIOGEL PI INDICATOR 7.0 (GLOVE) ×6
GLOVE BIOGEL PI INDICATOR 7.5 (GLOVE) ×2
GOWN STRL REUS W/ TWL LRG LVL3 (GOWN DISPOSABLE) ×2 IMPLANT
GOWN STRL REUS W/ TWL XL LVL3 (GOWN DISPOSABLE) ×1 IMPLANT
GOWN STRL REUS W/TWL LRG LVL3 (GOWN DISPOSABLE) ×4
GOWN STRL REUS W/TWL XL LVL3 (GOWN DISPOSABLE) ×2
GRAFT BALLN CATH 65CM (STENTS) ×1 IMPLANT
GUIDEWIRE ANGLED .035X260CM (WIRE) ×3 IMPLANT
KIT BASIN OR (CUSTOM PROCEDURE TRAY) ×3 IMPLANT
KIT TURNOVER KIT B (KITS) ×3 IMPLANT
LEG CONTRALATERAL 18X11.5 (Endovascular Graft) ×3 IMPLANT
NEEDLE PERC 18GX7CM (NEEDLE) ×6 IMPLANT
NS IRRIG 1000ML POUR BTL (IV SOLUTION) ×3 IMPLANT
PACK ENDOVASCULAR (PACKS) ×3 IMPLANT
PAD ARMBOARD 7.5X6 YLW CONV (MISCELLANEOUS) ×6 IMPLANT
PERCLOSE PROGLIDE 6F (VASCULAR PRODUCTS) ×12
SET MICROPUNCTURE 5F STIFF (MISCELLANEOUS) ×3 IMPLANT
SHEATH AVANTI 11CM 8FR (SHEATH) IMPLANT
SHEATH BRITE TIP 8FR 23CM (SHEATH) ×3 IMPLANT
SHEATH DRYSEAL FLEX 12FR 33CM (SHEATH) ×1 IMPLANT
SHEATH DRYSEAL FLEX 16FR 33CM (SHEATH) ×1 IMPLANT
SHEATH PINNACLE 8F 10CM (SHEATH) ×3 IMPLANT
SHIELD RADPAD SCOOP 12X17 (MISCELLANEOUS) ×6 IMPLANT
SPONGE GAUZE 2X2 STER 10/PKG (GAUZE/BANDAGES/DRESSINGS) ×4
STENT GRAFT BALLN CATH 65CM (STENTS) ×2
STOPCOCK MORSE 400PSI 3WAY (MISCELLANEOUS) ×6 IMPLANT
SUT ETHILON 3 0 PS 1 (SUTURE) IMPLANT
SUT MNCRL AB 4-0 PS2 18 (SUTURE) ×6 IMPLANT
SUT PROLENE 5 0 C 1 24 (SUTURE) IMPLANT
SUT VIC AB 2-0 CT1 27 (SUTURE)
SUT VIC AB 2-0 CT1 TAPERPNT 27 (SUTURE) IMPLANT
SUT VIC AB 3-0 SH 27 (SUTURE)
SUT VIC AB 3-0 SH 27X BRD (SUTURE) IMPLANT
SYR 30ML LL (SYRINGE) ×3 IMPLANT
SYR MEDRAD MARK V 150ML (SYRINGE) ×3 IMPLANT
TOWEL GREEN STERILE (TOWEL DISPOSABLE) ×3 IMPLANT
TRAY FOLEY MTR SLVR 16FR STAT (CATHETERS) ×3 IMPLANT
TRAY FOLEY W/METER SILVER 14FR (SET/KITS/TRAYS/PACK) ×3 IMPLANT
TUBING HIGH PRESSURE 120CM (CONNECTOR) ×6 IMPLANT
WIRE AMPLATZ SS-J .035X180CM (WIRE) ×6 IMPLANT
WIRE BENTSON .035X145CM (WIRE) ×6 IMPLANT

## 2017-05-12 NOTE — H&P (Signed)
   History and Physical Update  The patient was interviewed and re-examined.  The patient's previous History and Physical has been reviewed and is unchanged from recent office visit. Plan for EVAR today. All questions answered.  Gigi Onstad C. Donzetta Matters, MD [facility] [place] Office: [phone] Pager: [phone]   05/12/2017, 7:05 AM

## 2017-05-12 NOTE — Anesthesia Preprocedure Evaluation (Addendum)
Anesthesia Evaluation  Patient identified by MRN, date of birth, ID band Patient awake    Reviewed: Allergy & Precautions, NPO status , Patient's Chart, lab work & pertinent test results  History of Anesthesia Complications Negative for: history of anesthetic complications  Airway Mallampati: II  TM Distance: >3 FB Neck ROM: Full    Dental  (+) Edentulous Upper, Edentulous Lower   Pulmonary neg shortness of breath, neg sleep apnea, neg COPD, neg recent URI, former smoker,    breath sounds clear to auscultation       Cardiovascular hypertension, Pt. on medications (-) angina+ Past MI and + Peripheral Vascular Disease   Rhythm:Regular     Neuro/Psych negative neurological ROS  negative psych ROS   GI/Hepatic Neg liver ROS, GERD  Controlled,  Endo/Other  Hypothyroidism   Renal/GU CRFRenal disease     Musculoskeletal   Abdominal   Peds  Hematology   Anesthesia Other Findings History includes former smoker (quit '17), HTN, AAA, CKD stage III, hypercholesterolemia, GERD, prostate cancer (brachytherapy 08/16/07), arthritis, hypothyroidism. No CAD history reported, but 05/03/17 stress test showed findings consistent with prior MI (inferior scar).  - PCP is Dr. Nolene Ebbs. His 03/10/17 office note is scanned under Media tab. - He was referred to cardiologist Dr. Jyl Heinz for preoperative evaluation (see 05/02/17 note). Stress test showed findings consistent with prior MI, but no further testing ordered following recent stress test.   Meds include amlodipine, ASA 81 mg, levothyroxine, pravastatin, trazodone. He reports that he is to continue ASA.  BP (!) 176/77   Pulse (!) 55   Temp 36.6 C   Resp 18   Ht 6\' 2"  (1.88 m)   Wt 209 lb 3.2 oz (94.9 kg)   SpO2 99%   BMI 26.86 kg/m   EKG 05/02/17: NSR, non-specific T wave abnormality.  Nuclear stress test 05/03/17:  Nuclear stress EF: 50%. The left ventricular  ejection fraction is mildly decreased (45-54%).  Defect 1: There is a large defect of moderate severity present in the basal inferior, mid inferior, mid inferolateral, apical anterior region and a 2nd defect in the apical inferior, apical lateral and apex location.  Findings consistent with prior myocardial infarction.  This is an intermediate risk study.  CT abd/pelvis 04/22/17: IMPRESSION: - 5.9 x 5.2 cm infrarenal abdominal aortic aneurysm is noted which extends to the aortic bifurcation. - Status post prostatic brachytherapy seed placement. Multiple small sclerotic densities are noted throughout the pelvis which may represent benign enostoses, but metastatic disease cannot be excluded given the history of prostate cancer. Comparison to prior studies if available would be helpful, as well as correlation with PSA levels.  Preoperative labs noted. H/H 12.9/37.7. PLT 187K. PT/PTT WNL. Glucose 91. T&S done. BUN/Cr 26/1.85. According to radiology records (scanned under Media tab), patient's labs on 04/22/17 showed BUN 35, Cr 2.1, GFR 33. Labs from 08/09/07 show a Cr or 1.69 on 08/09/07 and 1.48 on 10/03/09.      Reproductive/Obstetrics                            Anesthesia Physical Anesthesia Plan  ASA: III  Anesthesia Plan: General   Post-op Pain Management:    Induction: Intravenous  PONV Risk Score and Plan: 2 and Ondansetron and Dexamethasone  Airway Management Planned: Oral ETT  Additional Equipment: Arterial line  Intra-op Plan:   Post-operative Plan: Extubation in OR  Informed Consent: I have reviewed the patients History  and Physical, chart, labs and discussed the procedure including the risks, benefits and alternatives for the proposed anesthesia with the patient or authorized representative who has indicated his/her understanding and acceptance.   Dental advisory given  Plan Discussed with: CRNA and Surgeon  Anesthesia Plan Comments:          Anesthesia Quick Evaluation

## 2017-05-12 NOTE — Evaluation (Signed)
Dorsal Pedal pulse on left foot detected by doppler without difficulty Right dorsal pedal pulse at the limit of detection but present

## 2017-05-12 NOTE — Op Note (Signed)
    Patient name: Anthony Harmon MRN: [id number] DOB: Feb 26, 1932 Sex: male  05/12/2017 Pre-operative Diagnosis: AAA Post-operative diagnosis:  Same Surgeon:  Erlene Quan C. Donzetta Matters, MD Assistant: Arlee Muslim, PA Procedure Performed: 1.  Percutaneous bilateral femoral access and closure with pro-glide device x2 2.  By iliac aortic endograft with main body right 26 x 14 x 14 cm via 16 French sheath and contralateral left 18 x 11.5 cm through 12 French sheath  Indications: 84 year old male with history of abdominal aortic aneurysm now reached 5.5 cm.  He is undergone cardiac clearance and is indicated for endovascular repair.  Findings: The renal arteries at the level of L2 with the left being slightly lower.  We had seal in the bilateral common iliac arteries with patency of the bilateral hypogastric arteries.  At completion there were no notable endoleaks.   Procedure:  The patient was identified in the holding area and taken to the operating room where he was placed supine operating table and general anesthesia was induced.  He was sterilely prepped and draped in the abdomen bilateral groins in the usual fashion given antibiotics and timeout called.  We began with ultrasound-guided cannulation of first the left followed by the right common femoral arteries with micropuncture sheaths and we deployed the pro glide devices and 8 French sheath were placed.  Patient was then heparinized fully.  We then used Bentson wires to access the aorta and on the right side we exchanged for Amplatz and then placed our long 16 French sheath to the level of L2.  We then brought our main body device into place.  Sheath was retracted main body was deployed at this level from preoperative CT imaging.  From the left side we then used a Glidewire and Kumpe catheter to select our contralateral limb.  This was slightly difficult given impingement on the graft we had the constrained Artegraft and turned.  We were then able to easily  access it placed an Omni catheter inside of it and were able to turn it easily.  We then placed the on the above the graft and exchanged for Amplatz wire.  Our 12 French sheath was then placed through the left access site.  At this time we retracted our sheath below the hypo-and performed angiogram with 20 cc of contrast to demonstrate her hypogastrics as well as her renals.  We then brought our 11.5 cm contralateral limb and deployed this to the level of hypo-.  We then retracted our right-sided sheath and performed a retrograde angiogram to demonstrate a right-sided hypogastric artery and then fully deployed our main body.  We then molded the entire graft with MOB 37 balloon. Completion angiogram was performed with no endo-leaks noted.   Satisfied we exchanged for a Bentson wires.  We then percutaneous close her bilateral common femoral arteries.    After we confirmed blood flow to the feet we then gave 50 mg of protamine which patient tolerated well.  He was then allowed away from anesthesia having tolerated procedure without immediate complication.  All counts were correct at completion.  Contrast: 45 cc.  EBL 50 cc.   Keah Lamba C. Donzetta Matters, MD [facility] [place] Office: [phone] Pager: [phone]

## 2017-05-12 NOTE — Transfer of Care (Signed)
Immediate Anesthesia Transfer of Care Note  Patient: Anthony Harmon  Procedure(s) Performed: ABDOMINAL AORTIC ENDOVASCULAR STENT GRAFT (N/A Abdomen)  Patient Location: PACU  Anesthesia Type:General  Level of Consciousness: drowsy and patient cooperative  Airway & Oxygen Therapy: Patient Spontanous Breathing and Patient connected to nasal cannula oxygen  Post-op Assessment: Report given to RN  Post vital signs: Reviewed and stable  Last Vitals:  Vitals Value Taken Time  BP    Temp    Pulse 57 05/12/2017  9:41 AM  Resp    SpO2 97 % 05/12/2017  9:41 AM  Vitals shown include unvalidated device data.  Last Pain:  Vitals:   05/12/17 0624  TempSrc:   PainSc: 6          Complications: No apparent anesthesia complications

## 2017-05-12 NOTE — Anesthesia Procedure Notes (Signed)
Arterial Line Insertion Start/End3/28/2019 7:00 AM, 05/12/2017 7:05 AM Performed by: Barrington Ellison, CRNA, CRNA  Patient location: Pre-op. Lidocaine 1% used for infiltration and patient sedated Right, radial was placed Catheter size: 20 G Hand hygiene performed  and maximum sterile barriers used  Allen's test indicative of satisfactory collateral circulation Attempts: 2 Procedure performed without using ultrasound guided technique. Following insertion, dressing applied and Biopatch. Post procedure assessment: normal  Patient tolerated the procedure well with no immediate complications.

## 2017-05-12 NOTE — Anesthesia Procedure Notes (Signed)
Procedure Name: Intubation Date/Time: 05/12/2017 7:45 AM Performed by: Barrington Ellison, CRNA Pre-anesthesia Checklist: Patient identified, Emergency Drugs available, Suction available and Patient being monitored Patient Re-evaluated:Patient Re-evaluated prior to induction Oxygen Delivery Method: Circle System Utilized Preoxygenation: Pre-oxygenation with 100% oxygen Induction Type: IV induction Ventilation: Mask ventilation without difficulty, Two handed mask ventilation required and Oral airway inserted - appropriate to patient size Laryngoscope Size: Mac and 3 Grade View: Grade I Tube type: Oral Tube size: 7.5 mm Number of attempts: 1 Airway Equipment and Method: Stylet and Oral airway Placement Confirmation: ETT inserted through vocal cords under direct vision,  positive ETCO2 and breath sounds checked- equal and bilateral Secured at: 23 cm Tube secured with: Tape Dental Injury: Teeth and Oropharynx as per pre-operative assessment

## 2017-05-12 NOTE — Progress Notes (Signed)
Dr Donzetta Matters at bedside aware of ALINE BP 18O 190 CUFF PRESSURE 130 150.S MAY GO BY CUFF PRESSURE

## 2017-05-12 NOTE — Progress Notes (Signed)
reort given to taylor rn as caregiver

## 2017-05-12 NOTE — Progress Notes (Signed)
Unable to palpate or dopple right dorsalis pedis pulse seems to be able to find with doppler then will disappar foot cool to touch but no cooler then left foot pt also having pairing pvc...consulted with matt PA  And notifed no new orders obtained will continue to monitor and observe and monitor pain

## 2017-05-13 ENCOUNTER — Encounter (HOSPITAL_COMMUNITY): Payer: Self-pay | Admitting: Vascular Surgery

## 2017-05-13 LAB — CBC
HEMATOCRIT: 31.6 % — AB (ref 39.0–52.0)
HEMOGLOBIN: 10.9 g/dL — AB (ref 13.0–17.0)
MCH: 31.7 pg (ref 26.0–34.0)
MCHC: 34.5 g/dL (ref 30.0–36.0)
MCV: 91.9 fL (ref 78.0–100.0)
Platelets: 162 10*3/uL (ref 150–400)
RBC: 3.44 MIL/uL — AB (ref 4.22–5.81)
RDW: 13.2 % (ref 11.5–15.5)
WBC: 9.7 10*3/uL (ref 4.0–10.5)

## 2017-05-13 LAB — BASIC METABOLIC PANEL
ANION GAP: 9 (ref 5–15)
BUN: 26 mg/dL — AB (ref 6–20)
CHLORIDE: 110 mmol/L (ref 101–111)
CO2: 21 mmol/L — AB (ref 22–32)
Calcium: 8.9 mg/dL (ref 8.9–10.3)
Creatinine, Ser: 2.07 mg/dL — ABNORMAL HIGH (ref 0.61–1.24)
GFR calc Af Amer: 33 mL/min — ABNORMAL LOW (ref >60)
GFR, EST NON AFRICAN AMERICAN: 28 mL/min — AB (ref >60)
GLUCOSE: 106 mg/dL — AB (ref 65–99)
POTASSIUM: 4 mmol/L (ref 3.5–5.1)
Sodium: 140 mmol/L (ref 135–145)

## 2017-05-13 MED ORDER — OXYCODONE-ACETAMINOPHEN 5-325 MG PO TABS: 1.0000 | ORAL_TABLET | Freq: Four times a day (QID) | ORAL | 0 refills | Status: DC | PRN

## 2017-05-13 NOTE — Care Management Note (Signed)
Case Management Note Marvetta Gibbons RN, BSN Unit 4E-Case Manager [phone]  Patient Details  Name: Donavon Kimrey MRN: [id number] Date of Birth: [birth year]  Subjective/Objective:  Pt admitted s/p AAA repair                 Action/Plan: PTA pt lived at home - plan to return home, no CM needs noted for transition home.   Expected Discharge Date:  05/13/17               Expected Discharge Plan:  Home/Self Care  In-House Referral:  NA  Discharge planning Services  CM Consult  Post Acute Care Choice:  NA Choice offered to:  NA  DME Arranged:    DME Agency:     HH Arranged:    HH Agency:     Status of Service:  Completed, signed off  If discussed at [place] of Stay Meetings, dates discussed:    Discharge Disposition: home/self care   Additional Comments:  Dawayne Patricia, RN 05/13/2017, 10:29 AM

## 2017-05-13 NOTE — Progress Notes (Signed)
  Progress Note    05/13/2017 7:33 AM 1 Day Post-Op  Subjective:  Denies rest pain R foot.  Some soreness bilateral groins but wanting to go home today   Vitals:   05/12/17 2354 05/13/17 0400  BP: 136/62 (!) 148/85  Pulse: 67 85  Resp: (!) 22 14  Temp: 99.4 F (37.4 C) 98.5 F (36.9 C)  SpO2: 99% 98%   Physical Exam: Cardiac:  RRR Lungs:  Non labored Incisions:  B groin incisions without hematoma or bleeding Extremities:  Palpable femoral pulses L>R; soft R AT by doppler; brisk L DP by doppler Abdomen:  Soft, non tender Neurologic: A&O  CBC    Component Value Date/Time   WBC 9.7 05/13/2017 0330   RBC 3.44 (L) 05/13/2017 0330   HGB 10.9 (L) 05/13/2017 0330   HCT 31.6 (L) 05/13/2017 0330   PLT 162 05/13/2017 0330   MCV 91.9 05/13/2017 0330   MCH 31.7 05/13/2017 0330   MCHC 34.5 05/13/2017 0330   RDW 13.2 05/13/2017 0330    BMET    Component Value Date/Time   NA 140 05/13/2017 0330   K 4.0 05/13/2017 0330   CL 110 05/13/2017 0330   CO2 21 (L) 05/13/2017 0330   GLUCOSE 106 (H) 05/13/2017 0330   BUN 26 (H) 05/13/2017 0330   CREATININE 2.07 (H) 05/13/2017 0330   CALCIUM 8.9 05/13/2017 0330   GFRNONAA 28 (L) 05/13/2017 0330   GFRAA 33 (L) 05/13/2017 0330    INR    Component Value Date/Time   INR 1.21 05/12/2017 0950     Intake/Output Summary (Last 24 hours) at 05/13/2017 0733 Last data filed at 05/13/2017 0500 Gross per 24 hour  Intake 1400 ml  Output 2250 ml  Net -850 ml     Assessment/Plan:  84 y.o. male is s/p endovascular repair of AAA 1 Day Post-Op   Slight increase in creatinine after dye exposure No pain or other ischemic symptoms RLE despite diminished doppler signal Patient must ambulate without difficulty and void prior to discharge today   Dagoberto Ligas, PA-C [facility] [phone] 05/13/2017 7:33 AM

## 2017-05-13 NOTE — Progress Notes (Signed)
05/13/2017 1:00 PM Discharge AVS meds taken today and those due this evening reviewed.  Follow-up appointments and when to call md reviewed.  D/C IV and TELE.  Questions and concerns addressed.   D/C home per orders. [place]

## 2017-05-13 NOTE — Plan of Care (Signed)
Progressing

## 2017-05-13 NOTE — Discharge Summary (Signed)
EVAR Discharge Summary   Anthony Harmon 02/08/1933 84 y.o. male  MRN: [id number]  Admission Date: 05/12/2017  Discharge Date: 05/13/17  Physician: Thomes Lolling*  Admission Diagnosis: abdominal aortic aneurysm  Discharge Day services:    see progress note 05/13/17 Physical Exam: Vitals:   05/13/17 0800 05/13/17 1034  BP: (!) 169/91 (!) 160/72  Pulse: 87   Resp: 15   Temp: 98 F (36.7 C)   SpO2: 98%     Hospital Course:  The patient was admitted to the hospital and taken to the operating room on 05/12/2017 and underwent: Endovascular repair of abdominal aortic aneurysm    The pt tolerated the procedure well and was transported to the PACU in good condition.   The remainder of the hospital course consisted of increasing mobilization and increasing intake of solids without difficulty.  Morning drawn labs POD#1 demonstrated a slight increase in creatinine from 1.8 to 2.0.  Patient is making good urine and has voided several times this morning.  Despite a soft R femoral pulse and R ATA pulse by doppler he denies any pain or claudication while walking through the halls.  He will be prescribed 2 days of narcotic pain medication for continued post operative pain control.  He will follow up in office with CT abd/pelvis in about 4 weeks.  Discharge instructions were reviewed with the patient and he voices his understanding.  He will be discharged this afternoon in stable condition.  CBC    Component Value Date/Time   WBC 9.7 05/13/2017 0330   RBC 3.44 (L) 05/13/2017 0330   HGB 10.9 (L) 05/13/2017 0330   HCT 31.6 (L) 05/13/2017 0330   PLT 162 05/13/2017 0330   MCV 91.9 05/13/2017 0330   MCH 31.7 05/13/2017 0330   MCHC 34.5 05/13/2017 0330   RDW 13.2 05/13/2017 0330    BMET    Component Value Date/Time   NA 140 05/13/2017 0330   K 4.0 05/13/2017 0330   CL 110 05/13/2017 0330   CO2 21 (L) 05/13/2017 0330   GLUCOSE 106 (H) 05/13/2017 0330   BUN 26 (H)  05/13/2017 0330   CREATININE 2.07 (H) 05/13/2017 0330   CALCIUM 8.9 05/13/2017 0330   GFRNONAA 28 (L) 05/13/2017 0330   GFRAA 33 (L) 05/13/2017 0330         Discharge Diagnosis:  abdominal aortic aneurysm  Secondary Diagnosis: Patient Active Problem List   Diagnosis Date Noted  . S/P AAA repair 05/12/2017  . Mixed dyslipidemia 05/02/2017  . AAA (abdominal aortic aneurysm) without rupture ([name]) 05/02/2017  . Preoperative cardiovascular examination 05/02/2017  . Ex-smoker 05/02/2017  . MALIGNANT NEOPLASM OF PROSTATE 10/03/2009  . OTHER AND UNSPECIFIED HYPERLIPIDEMIA 10/03/2009  . ESSENTIAL HYPERTENSION, BENIGN 10/03/2009  . SHOULDER IMPINGEMENT SYNDROME, RIGHT 10/03/2009   Past Medical History:  Diagnosis Date  . AAA (abdominal aortic aneurysm) ([name])   . Arthritis   . CKD (chronic kidney disease)    stage III 02/2017  . GERD (gastroesophageal reflux disease)   . High cholesterol   . Hypertension   . Hypothyroidism   . Prostate cancer [name])    s/p brachytherapy 08/16/07     Allergies as of 05/13/2017   No Known Allergies     Medication List    TAKE these medications   amLODipine 5 MG tablet Commonly known as:  NORVASC Take 5 mg by mouth daily.   aspirin EC 81 MG tablet Take 81 mg by mouth daily.   ibuprofen 200 MG tablet  Commonly known as:  ADVIL,MOTRIN Take 200 mg by mouth every 6 (six) hours as needed for headache or moderate pain.   ICY HOT EX Apply 1 application topically daily as needed (for back pain).   levothyroxine 75 MCG tablet Commonly known as:  SYNTHROID, LEVOTHROID Take 75 mcg by mouth daily.   oxyCODONE-acetaminophen 5-325 MG tablet Commonly known as:  PERCOCET/ROXICET Take 1 tablet by mouth every 6 (six) hours as needed for moderate pain.   pravastatin 20 MG tablet Commonly known as:  PRAVACHOL Take 20 mg by mouth daily.   traZODone 50 MG tablet Commonly known as:  DESYREL Take 50 mg by mouth at bedtime as needed for sleep.     VITAMIN D3 PO Take 1 capsule by mouth daily.       Discharge Instructions:   [facility]  Discharge Instructions Endovascular Aortic Aneurysm Repair  Please refer to the following instructions for your post-procedure care. Your surgeon or Physician Assistant will discuss any changes with you.  Activity  You are encouraged to walk as much as you can. You can slowly return to normal activities but must avoid strenuous activity and heavy lifting until your doctor tells you it's OK. Avoid activities such as vacuuming or swinging a gold club. It is normal to feel tired for several weeks after your surgery. Do not drive until your doctor gives the OK and you are no longer taking prescription pain medications. It is also normal to have difficulty with sleep habits, eating, and bowel movements after surgery. These will go away with time.  Bathing/Showering  You may shower after you go home. If you have an incision, do not soak in a bathtub, hot tub, or swim until the incision heals completely.  Incision Care  Shower every day. Clean your incision with mild soap and water. Pat the area dry with a clean towel. You do not need a bandage unless otherwise instructed. Do not apply any ointments or creams to your incision. If you clothing is irritating, you may cover your incision with a dry gauze pad.  Diet  Resume your normal diet. There are no special food restrictions following this procedure. A low fat/low cholesterol diet is recommended for all patients with vascular disease. In order to heal from your surgery, it is CRITICAL to get adequate nutrition. Your body requires vitamins, minerals, and protein. Vegetables are the best source of vitamins and minerals. Vegetables also provide the perfect balance of protein. Processed food has little nutritional value, so try to avoid this.  Medications  Resume taking all of your medications unless your doctor or  Physician Assistnat tells you not to. If your incision is causing pain, you may take over-the-counter pain relievers such as acetaminophen (Tylenol). If you were prescribed a stronger pain medication, please be aware these medications can cause nausea and constipation. Prevent nausea by taking the medication with a snack or meal. Avoid constipation by drinking plenty of fluids and eating foods with a high amount of fiber, such as fruits, vegetables, and grains. Do not take Tylenol if you are taking prescription pain medications.   Follow up  [place] office will schedule a follow-up appointment with a C.T. scan 3-4 weeks after your surgery.  Please call us immediately for any of the following conditions  Severe or worsening pain in your legs or feet or in your abdomen back or chest. Increased pain, redness, drainage (pus) from your incision sit. Increased abdominal pain, bloating, nausea, vomiting  or persistent diarrhea. Fever of 101 degrees or higher. Swelling in your leg (s),  Reduce your risk of vascular disease  .Stop smoking. If you would like help call [facility] at 1-800-QUIT-NOW 905-005-2252) or [place] at [phone]. .Manage your cholesterol .Maintain a desired weight .Control your diabetes .Keep your blood pressure down  If you have questions, please call the office at [phone].   Disposition: home  Patient's condition: is Good  Follow up: 1. Dr. Donzetta Matters in 4 weeks with CTA protocol   Dagoberto Ligas, PA-C [facility] [phone] 05/13/2017  12:33 PM   - For VQI Registry use - Post-op:  Time to Extubation: [x]  In OR, [ ]  < 12 hrs, [ ]  12-24 hrs, [ ]  >=24 hrs Vasopressors Req. Post-op: No MI: No., [ ]  Troponin only, [ ]  EKG or Clinical New Arrhythmia: No CHF: No ICU Stay: no Transfusion: No     If yes,  units given  Complications: Resp failure: No., [ ]  Pneumonia, [ ]  Ventilator Chg in renal function: No., [ ]  Inc. Cr > 0.5, [ ]   Temp. Dialysis,  [ ]  Permanent dialysis Leg ischemia: No., no Surgery needed, [ ]  Yes, Surgery needed,  [ ]  Amputation Bowel ischemia: No., [ ]  Medical Rx, [ ]  Surgical Rx Wound complication: No., [ ]  Superficial separation/infection, [ ]  Return to OR Return to OR: No  Return to OR for bleeding: No Stroke: No., [ ]  Minor, [ ]  Major  Discharge medications: Statin use:  Yes  ASA use:  Yes  Plavix use:  No  Beta blocker use:  No  ARB use:  No ACEI use:  No CCB use:  Yes

## 2017-05-13 NOTE — Progress Notes (Signed)
Foley catheter and art line discontinued

## 2017-05-13 NOTE — Discharge Instructions (Signed)
   [facility] [place]   Discharge Instructions  Endovascular Aortic Aneurysm Repair  Please refer to the following instructions for your post-procedure care. Your surgeon or Physician Assistant will discuss any changes with you.  Activity  You are encouraged to walk as much as you can. You can slowly return to normal activities but must avoid strenuous activity and heavy lifting until your doctor tells you it's OK. Avoid activities such as vacuuming or swinging a gold club. It is normal to feel tired for several weeks after your surgery. Do not drive until your doctor gives the OK and you are no longer taking prescription pain medications. It is also normal to have difficulty with sleep habits, eating, and bowel movements after surgery. These will go away with time.  Bathing/Showering  You may shower after you go home. If you have an incision, do not soak in a bathtub, hot tub, or swim until the incision heals completely.  Incision Care  Shower every day. Clean your incision with mild soap and water. Pat the area dry with a clean towel. You do not need a bandage unless otherwise instructed. Do not apply any ointments or creams to your incision. If you clothing is irritating, you may cover your incision with a dry gauze pad.  Diet  Resume your normal diet. There are no special food restrictions following this procedure. A low fat/low cholesterol diet is recommended for all patients with vascular disease. In order to heal from your surgery, it is CRITICAL to get adequate nutrition. Your body requires vitamins, minerals, and protein. Vegetables are the best source of vitamins and minerals. Vegetables also provide the perfect balance of protein. Processed food has little nutritional value, so try to avoid this.  Medications  Resume taking all of your medications unless your doctor or nurse practitioner tells you not to. If your incision is causing pain, you may take  over-the-counter pain relievers such as acetaminophen (Tylenol). If you were prescribed a stronger pain medication, please be aware these medications can cause nausea and constipation. Prevent nausea by taking the medication with a snack or meal. Avoid constipation by drinking plenty of fluids and eating foods with a high amount of fiber, such as fruits, vegetables, and grains. Do not take Tylenol if you are taking prescription pain medications.   Follow up  Our office will schedule a follow-up appointment with a C.T. scan 3-4 weeks after your surgery.  Please call us immediately for any of the following conditions  Severe or worsening pain in your legs or feet or in your abdomen back or chest. Increased pain, redness, drainage (pus) from your incision sit. Increased abdominal pain, bloating, nausea, vomiting or persistent diarrhea. Fever of 101 degrees or higher. Swelling in your leg (s),  Reduce your risk of vascular disease  Stop smoking. If you would like help call [facility] at 1-800-QUIT-NOW (1-800-784-8669) or [place] at [phone]. Manage your cholesterol Maintain a desired weight Control your diabetes Keep your blood pressure down  If you have questions, please call the office at [phone].   

## 2017-05-18 ENCOUNTER — Telehealth: Payer: Self-pay | Admitting: Vascular Surgery

## 2017-05-18 NOTE — Telephone Encounter (Signed)
-----   Message from Mena Goes, RN sent at 05/13/2017  2:14 PM EDT ----- Regarding: Correction, Pt needs EVAR duplex not CTA in 4 weeks   ----- Message ----- From: Iline Oven Sent: 05/13/2017   1:40 PM To: [facility] Charge Pool  CORRECTION: this patient will need an EVAR duplex in 4 weeks INSTEAD of CTA due to renal insufficiency. Thanks, [facility]

## 2017-05-18 NOTE — Telephone Encounter (Signed)
LVM for appts 5/3 [place] and [place]

## 2017-05-19 NOTE — Anesthesia Postprocedure Evaluation (Signed)
Anesthesia Post Note  Patient: Anthony Harmon  Procedure(s) Performed: ABDOMINAL AORTIC ENDOVASCULAR STENT GRAFT (N/A Abdomen)     Patient location during evaluation: PACU Anesthesia Type: General Level of consciousness: awake and alert Pain management: pain level controlled Vital Signs Assessment: post-procedure vital signs reviewed and stable Respiratory status: spontaneous breathing, nonlabored ventilation, respiratory function stable and patient connected to nasal cannula oxygen Cardiovascular status: blood pressure returned to baseline and stable Postop Assessment: no apparent nausea or vomiting Anesthetic complications: no    Last Vitals:  Vitals:   05/13/17 0800 05/13/17 1034  BP: (!) 169/91 (!) 160/72  Pulse: 87   Resp: 15   Temp: 36.7 C   SpO2: 98%     Last Pain:  Vitals:   05/13/17 1150  TempSrc:   PainSc: 0-No pain                 Ruben Pyka

## 2017-05-23 ENCOUNTER — Other Ambulatory Visit: Payer: Self-pay

## 2017-05-23 DIAGNOSIS — I713 Abdominal aortic aneurysm, ruptured, unspecified: Secondary | ICD-10-CM

## 2017-06-17 ENCOUNTER — Ambulatory Visit (HOSPITAL_COMMUNITY)
Admission: RE | Admit: 2017-06-17 | Discharge: 2017-06-17 | Disposition: A | Discharge status: Home / Self Care | Payer: Medicare Other | Source: Ambulatory Visit | Attending: Vascular Surgery | Admitting: Vascular Surgery

## 2017-06-17 ENCOUNTER — Other Ambulatory Visit: Payer: Self-pay

## 2017-06-17 ENCOUNTER — Ambulatory Visit (INDEPENDENT_AMBULATORY_CARE_PROVIDER_SITE_OTHER): Payer: Self-pay | Admitting: Vascular Surgery

## 2017-06-17 ENCOUNTER — Encounter: Payer: Self-pay | Admitting: Vascular Surgery

## 2017-06-17 VITALS — BP 162/88 | HR 57 | Temp 97.3°F | Resp 18 | Ht 74.0 in | Wt 207.0 lb

## 2017-06-17 DIAGNOSIS — I713 Abdominal aortic aneurysm, ruptured, unspecified: Secondary | ICD-10-CM

## 2017-06-17 DIAGNOSIS — Z95828 Presence of other vascular implants and grafts: Secondary | ICD-10-CM | POA: Insufficient documentation

## 2017-06-17 NOTE — Progress Notes (Signed)
  Subjective:     Patient ID: Anthony Harmon, male   DOB: 03-20-1932, 84 y.o.   MRN: [id number]  HPI 84 year old male follows up after endovascular aneurysm repair for 5 cm aneurysm.  He is done very well does not have any complaints is back to his normal level of activity.   Review of Systems No complaints today    Objective:   Physical Exam Awake alert and oriented Abdomen is soft Palpable femoral pulses bilaterally    Assessment 84 year old follows up after endovascular repair of his abdominal aortic aneurysm.  He is doing well/plan     And duplex demonstrates a patent graft today with biphasic waveforms throughout and a maximal diameter 5.4 cm.  We will repeat his duplex in 6 months unless he has issues before that time.  After that he will go to 1 year follow-up.  He demonstrates good understanding.  Marchella Hibbard C. Donzetta Matters, MD [facility] [place] Office: [phone] Pager: [phone]

## 2017-07-29 ENCOUNTER — Other Ambulatory Visit: Payer: Self-pay

## 2017-07-29 DIAGNOSIS — I713 Abdominal aortic aneurysm, ruptured, unspecified: Secondary | ICD-10-CM

## 2017-12-19 ENCOUNTER — Other Ambulatory Visit: Payer: Self-pay

## 2017-12-19 ENCOUNTER — Ambulatory Visit (INDEPENDENT_AMBULATORY_CARE_PROVIDER_SITE_OTHER): Payer: Medicare Other | Admitting: Family

## 2017-12-19 ENCOUNTER — Ambulatory Visit (HOSPITAL_COMMUNITY)
Admission: RE | Admit: 2017-12-19 | Discharge: 2017-12-19 | Disposition: A | Discharge status: Home / Self Care | Payer: Medicare Other | Source: Ambulatory Visit | Attending: Family | Admitting: Family

## 2017-12-19 ENCOUNTER — Encounter: Payer: Self-pay | Admitting: Family

## 2017-12-19 VITALS — BP 178/93 | HR 56 | Temp 97.1°F | Resp 18 | Ht 74.0 in | Wt 212.0 lb

## 2017-12-19 DIAGNOSIS — R030 Elevated blood-pressure reading, without diagnosis of hypertension: Secondary | ICD-10-CM

## 2017-12-19 DIAGNOSIS — R0989 Other specified symptoms and signs involving the circulatory and respiratory systems: Secondary | ICD-10-CM

## 2017-12-19 DIAGNOSIS — I714 Abdominal aortic aneurysm, without rupture, unspecified: Secondary | ICD-10-CM

## 2017-12-19 DIAGNOSIS — I713 Abdominal aortic aneurysm, ruptured, unspecified: Secondary | ICD-10-CM

## 2017-12-19 DIAGNOSIS — Z95828 Presence of other vascular implants and grafts: Secondary | ICD-10-CM | POA: Diagnosis not present

## 2017-12-19 NOTE — Patient Instructions (Signed)
Before your next abdominal ultrasound:  Avoid gas forming foods and beverages the day before the test.   Take two Extra-Strength Gas-X capsules at bedtime the night before the test. Take another two Extra-Strength Gas-X capsules in the middle of the night if you get up to the restroom, if not, first thing in the morning with water.  Do not chew gum.     

## 2017-12-19 NOTE — Progress Notes (Signed)
Vitals:   12/19/17 0837  BP: (!) 203/99  Pulse: (!) 56  Resp: 18  Temp: (!) 97.1 F (36.2 C)  TempSrc: Oral  SpO2: 97%  Weight: 212 lb (96.2 kg)  Height: 6\' 2"  (1.88 m)

## 2017-12-19 NOTE — Progress Notes (Signed)
[facility] [place]  CC: Follow up s/p Endovascular Repair of Abdominal Aortic Aneurysm    History of Present Illness  Anthony Harmon is a 84 y.o. (Sep 09, 1932) male who is s/p endovascular aneurysm repair for 5 cm aneurysm on 05-12-17 by Dr. Donzetta Matters.  He has known chronic lumbar spine problems, states localized low back pain is worse in the morning and with sitting.  He denies pain or weakness in the muscles of his legs with walking. He does seem to have sciatic type symptoms in both ;egs with walking, does not seem to be claudication type sx's.  He denies any known hx of stroke or TIA.   He denies headache, denies chest pain, denies dyspnea. He does not check his blood pressure at home.   Pt states he did not know that long term and regular use of ibuprofen could damage his kidneys, he has been taking Advil for 3-4 months, one of his diagnoses is CKD. His blood pressure is elevated now.  Pt states he has been offered injections in his back to help his pain.   Diabetic: No Tobaccos use: former smoker, quit in 2017, smoked x 20 years   Past Medical History:  Diagnosis Date  . AAA (abdominal aortic aneurysm) ([place])   . Arthritis   . CKD (chronic kidney disease)    stage III 02/2017  . GERD (gastroesophageal reflux disease)   . High cholesterol   . Hypertension   . Hypothyroidism   . Prostate cancer [facility])    s/p brachytherapy 08/16/07   Past Surgical History:  Procedure Laterality Date  . ABDOMINAL AORTIC ENDOVASCULAR STENT GRAFT N/A 05/12/2017   Procedure: ABDOMINAL AORTIC ENDOVASCULAR STENT GRAFT;  Surgeon: Waynetta Sandy, MD;  Location: [facility];  Service: Vascular;  Laterality: N/A;  . None     Social History Social History   Tobacco Use  . Smoking status: Former Smoker    Packs/day: 0.25    Years: 20.00    Pack years: 5.00    Types: Cigarettes    Last attempt to quit: 04/23/2015    Years since quitting: 2.6  . Smokeless tobacco: Never Used   Substance Use Topics  . Alcohol use: No  . Drug use: No   Family History History reviewed. No pertinent family history. Current Outpatient Medications on File Prior to Visit  Medication Sig Dispense Refill  . amLODipine (NORVASC) 5 MG tablet Take 5 mg by mouth daily.     Marland Kitchen aspirin EC 81 MG tablet Take 81 mg by mouth daily.    . Cholecalciferol (VITAMIN D3 PO) Take 1 capsule by mouth daily.    Marland Kitchen ibuprofen (ADVIL,MOTRIN) 200 MG tablet Take 200 mg by mouth every 6 (six) hours as needed for headache or moderate pain.    Marland Kitchen levothyroxine (SYNTHROID, LEVOTHROID) 75 MCG tablet Take 75 mcg by mouth daily.     . Menthol, Topical Analgesic, (ICY HOT EX) Apply 1 application topically daily as needed (for back pain).    . pravastatin (PRAVACHOL) 20 MG tablet Take 20 mg by mouth daily.     . pravastatin (PRAVACHOL) 40 MG tablet Take 40 mg by mouth at bedtime.  1  . traZODone (DESYREL) 50 MG tablet Take 50 mg by mouth at bedtime as needed for sleep.     Marland Kitchen oxyCODONE-acetaminophen (PERCOCET/ROXICET) 5-325 MG tablet Take 1 tablet by mouth every 6 (six) hours as needed for moderate pain. (Patient not taking: Reported on 12/19/2017) 8 tablet 0  No current facility-administered medications on file prior to visit.    No Known Allergies   ROS: See HPI for pertinent positives and negatives.  Physical Examination  Vitals:   12/19/17 0837 12/19/17 0842  BP: (!) 203/99 (!) 178/93  Pulse: (!) 56 (!) 56  Resp: 18   Temp: (!) 97.1 F (36.2 C)   TempSrc: Oral   SpO2: 97%   Weight: 212 lb (96.2 kg)   Height: 6\' 2"  (1.88 m)    Body mass index is 27.22 kg/m.  General: A&O x 3, WD, male HEENT: No gross abnormalities  Pulmonary: Sym exp, respirations are non labored, good air movement in all fields CTAB, no rales, rhonchi, or wheezes.  Cardiac: Regular rhythm and rate, no murmur appreciated  Vascular: Vessel Right Left  Radial 2+Palpable 2+Palpable  Carotid  without bruit  without bruit  Aorta Not  palpable N/A  Femoral notPalpable 2+Palpable  Popliteal Not palpable Not palpable  PT notPalpable  Not Palpable  DP 1+Palpable Not Palpable   Gastrointestinal: soft, NTND, -G/R, - HSM, - palpable masses, - CVAT B. Musculoskeletal: M/S 5/5 throughout, extremities without ischemic changes. Skin: No rashes, no ulcers, no cellulitis.   Neurologic: Pain and light touch intact in extremities, Motor exam as listed above. CN 2-12 grossly intact.  Psychiatric: Normal thought content, mood appropriate for clinical situation.    DATA  EVAR Duplex   Previous (Date: 06-17-17) AAA sac size: 5.4 cm; Right CIA: 1.3 cm; Left CIA: 1.3 cm  Current (Date: 12-19-17)  AAA sac size: 3.8 cm; Right CIA: 1.4 cm; Left CIA: 1.6 cm  no endoleak detected   Medical Decision Making  Anthony Harmon is a 84 y.o. male who presents s/p EVAR (Date: 05-12-17).  Pt is asymptomatic with decreased sac size to 3.8 cm.  Pt states he did not know that long term and regular use of ibupuprofen could dmamge his kidneys, he has been taking advil for 3-4 months, one of his diagnoses is CKD.  His blood pressure is also elevated now. I advise him to check his blood pressure at home, or in a pharmacy, and to see his PCP about his blood pressure and another form of pain relief for his chronic back pain.  Decreased pedal pulses with no signs of ischemia, no claudication sx's: will check ABI's on his return in 6 months for EVAR duplex.   I discussed with the patient the importance of surveillance of the endograft.  I emphasized the importance of maximal medical management including strict control of blood pressure, blood glucose, and lipid levels, antiplatelet agents, obtaining regular exercise, and cessation of smoking.   Thank you for allowing [place] to participate in this patient's care.  Clemon Chambers, RN, MSN, FNP-C [facility] [place] Office: [facility] Physician: Trula Slade  12/19/2017,  8:47 AM

## 2018-01-11 ENCOUNTER — Emergency Department (HOSPITAL_COMMUNITY)
Admission: EM | Admit: 2018-01-11 | Discharge: 2018-01-11 | Disposition: A | Discharge status: Home / Self Care | Payer: Medicare Other | Attending: Emergency Medicine | Admitting: Emergency Medicine

## 2018-01-11 ENCOUNTER — Emergency Department (HOSPITAL_COMMUNITY): Payer: Medicare Other

## 2018-01-11 ENCOUNTER — Encounter (HOSPITAL_COMMUNITY): Payer: Self-pay | Admitting: *Deleted

## 2018-01-11 ENCOUNTER — Other Ambulatory Visit: Payer: Self-pay

## 2018-01-11 DIAGNOSIS — S01511A Laceration without foreign body of lip, initial encounter: Secondary | ICD-10-CM | POA: Insufficient documentation

## 2018-01-11 DIAGNOSIS — Y999 Unspecified external cause status: Secondary | ICD-10-CM | POA: Diagnosis not present

## 2018-01-11 DIAGNOSIS — Y929 Unspecified place or not applicable: Secondary | ICD-10-CM | POA: Insufficient documentation

## 2018-01-11 DIAGNOSIS — Z79899 Other long term (current) drug therapy: Secondary | ICD-10-CM | POA: Diagnosis not present

## 2018-01-11 DIAGNOSIS — S0990XA Unspecified injury of head, initial encounter: Secondary | ICD-10-CM | POA: Insufficient documentation

## 2018-01-11 DIAGNOSIS — N183 Chronic kidney disease, stage 3 (moderate): Secondary | ICD-10-CM | POA: Diagnosis not present

## 2018-01-11 DIAGNOSIS — Z23 Encounter for immunization: Secondary | ICD-10-CM | POA: Insufficient documentation

## 2018-01-11 DIAGNOSIS — W228XXA Striking against or struck by other objects, initial encounter: Secondary | ICD-10-CM | POA: Diagnosis not present

## 2018-01-11 DIAGNOSIS — S43014A Anterior dislocation of right humerus, initial encounter: Secondary | ICD-10-CM

## 2018-01-11 DIAGNOSIS — E039 Hypothyroidism, unspecified: Secondary | ICD-10-CM | POA: Diagnosis not present

## 2018-01-11 DIAGNOSIS — Z87891 Personal history of nicotine dependence: Secondary | ICD-10-CM | POA: Diagnosis not present

## 2018-01-11 DIAGNOSIS — Y939 Activity, unspecified: Secondary | ICD-10-CM | POA: Diagnosis not present

## 2018-01-11 DIAGNOSIS — I129 Hypertensive chronic kidney disease with stage 1 through stage 4 chronic kidney disease, or unspecified chronic kidney disease: Secondary | ICD-10-CM | POA: Insufficient documentation

## 2018-01-11 DIAGNOSIS — Z7982 Long term (current) use of aspirin: Secondary | ICD-10-CM | POA: Insufficient documentation

## 2018-01-11 MED ORDER — HYDROMORPHONE HCL 1 MG/ML IJ SOLN
0.5000 mg | Freq: Once | INTRAMUSCULAR | Status: AC
  Administered 2018-01-11: 0.5 mg via INTRAVENOUS
  Filled 2018-01-11: qty 1

## 2018-01-11 MED ORDER — ONDANSETRON HCL 4 MG/2ML IJ SOLN
4.0000 mg | Freq: Once | INTRAMUSCULAR | Status: AC
  Administered 2018-01-11: 4 mg via INTRAVENOUS
  Filled 2018-01-11: qty 2

## 2018-01-11 MED ORDER — HYDROCODONE-ACETAMINOPHEN 5-325 MG PO TABS: 1.0000 | ORAL_TABLET | Freq: Four times a day (QID) | ORAL | 0 refills | Status: DC | PRN

## 2018-01-11 MED ORDER — BUPIVACAINE-EPINEPHRINE (PF) 0.25% -1:200000 IJ SOLN: 10.0000 mL | Freq: Once | INTRAMUSCULAR | Status: DC

## 2018-01-11 MED ORDER — ETOMIDATE 2 MG/ML IV SOLN
INTRAVENOUS | Status: AC
  Filled 2018-01-11: qty 20

## 2018-01-11 MED ORDER — ETOMIDATE 2 MG/ML IV SOLN
0.3000 mg/kg | Freq: Once | INTRAVENOUS | Status: AC
  Administered 2018-01-11: 14 mg via INTRAVENOUS

## 2018-01-11 MED ORDER — HYDROMORPHONE HCL 1 MG/ML IJ SOLN
1.0000 mg | Freq: Once | INTRAMUSCULAR | Status: AC
  Administered 2018-01-11: 1 mg via INTRAVENOUS
  Filled 2018-01-11: qty 1

## 2018-01-11 MED ORDER — PROPOFOL 10 MG/ML IV BOLUS: 200.0000 mg | Freq: Once | INTRAVENOUS | Status: DC

## 2018-01-11 MED ORDER — TETANUS-DIPHTH-ACELL PERTUSSIS 5-2.5-18.5 LF-MCG/0.5 IM SUSP
0.5000 mL | Freq: Once | INTRAMUSCULAR | Status: AC
  Administered 2018-01-11: 0.5 mL via INTRAMUSCULAR
  Filled 2018-01-11: qty 0.5

## 2018-01-11 MED ORDER — LIDOCAINE-EPINEPHRINE (PF) 2 %-1:200000 IJ SOLN
10.0000 mL | Freq: Once | INTRAMUSCULAR | Status: AC
  Administered 2018-01-11: 10 mL
  Filled 2018-01-11: qty 10

## 2018-01-11 NOTE — ED Provider Notes (Signed)
[facility] EMERGENCY DEPARTMENT Provider Note   CSN: [id number] Arrival date & time: 01/11/18  1053     History   Chief Complaint Chief Complaint  Patient presents with  . Arm Pain    possible fracture    HPI Anthony Harmon is a 84 y.o. male.  HPI   Anthony Harmon is a 84 y.o. male, with a history of AAA, CKD stage III, HTN, hypercholesterolemia, and hypothyroidism, presenting to the ED with injuries from mechanical fall that occurred just prior to arrival.  Complains of pain to the right shoulder primarily, sharp, severe, nonradiating. Also complains of facial trauma.  Denies LOC, shortness of breath, chest pain, abdominal pain, neuro deficits, or any other complaints.   Past Medical History:  Diagnosis Date  . AAA (abdominal aortic aneurysm) ([place])   . Arthritis   . CKD (chronic kidney disease)    stage III 02/2017  . GERD (gastroesophageal reflux disease)   . High cholesterol   . Hypertension   . Hypothyroidism   . Prostate cancer [facility])    s/p brachytherapy 08/16/07    Patient Active Problem List   Diagnosis Date Noted  . S/P AAA repair 05/12/2017  . Mixed dyslipidemia 05/02/2017  . AAA (abdominal aortic aneurysm) without rupture ([place]) 05/02/2017  . Preoperative cardiovascular examination 05/02/2017  . Ex-smoker 05/02/2017  . MALIGNANT NEOPLASM OF PROSTATE 10/03/2009  . OTHER AND UNSPECIFIED HYPERLIPIDEMIA 10/03/2009  . ESSENTIAL HYPERTENSION, BENIGN 10/03/2009  . SHOULDER IMPINGEMENT SYNDROME, RIGHT 10/03/2009    Past Surgical History:  Procedure Laterality Date  . ABDOMINAL AORTIC ENDOVASCULAR STENT GRAFT N/A 05/12/2017   Procedure: ABDOMINAL AORTIC ENDOVASCULAR STENT GRAFT;  Surgeon: Waynetta Sandy, MD;  Location: [facility];  Service: Vascular;  Laterality: N/A;  . None          Home Medications    Prior to Admission medications   Medication Sig Start Date End Date Taking? Authorizing Provider  amLODipine (NORVASC) 5  MG tablet Take 5 mg by mouth daily.  05/09/13   [provider]  aspirin EC 81 MG tablet Take 81 mg by mouth daily.    [provider]  Cholecalciferol (VITAMIN D3 PO) Take 1 capsule by mouth daily.    [provider]  HYDROcodone-acetaminophen (NORCO/VICODIN) 5-325 MG tablet Take 1 tablet by mouth every 6 (six) hours as needed for severe pain. 01/11/18   Hawk Mones C, PA-C  ibuprofen (ADVIL,MOTRIN) 200 MG tablet Take 200 mg by mouth every 6 (six) hours as needed for headache or moderate pain.    [provider]  levothyroxine (SYNTHROID, LEVOTHROID) 75 MCG tablet Take 75 mcg by mouth daily.  01/25/17   [provider]  Menthol, Topical Analgesic, (ICY HOT EX) Apply 1 application topically daily as needed (for back pain).    [provider]  oxyCODONE-acetaminophen (PERCOCET/ROXICET) 5-325 MG tablet Take 1 tablet by mouth every 6 (six) hours as needed for moderate pain. Patient not taking: Reported on 12/19/2017 05/13/17   Dagoberto Ligas, PA-C  pravastatin (PRAVACHOL) 20 MG tablet Take 20 mg by mouth daily.  04/26/13   [provider]  pravastatin (PRAVACHOL) 40 MG tablet Take 40 mg by mouth at bedtime. 10/05/17   [provider]  traZODone (DESYREL) 50 MG tablet Take 50 mg by mouth at bedtime as needed for sleep.  04/26/13   [provider]    Family History No family history on file.  Social History Social History   Tobacco Use  .  Smoking status: Former Smoker    Packs/day: 0.25    Years: 20.00    Pack years: 5.00    Types: Cigarettes    Last attempt to quit: 04/23/2015    Years since quitting: 2.7  . Smokeless tobacco: Never Used  Substance Use Topics  . Alcohol use: No  . Drug use: No     Allergies   Patient has no known allergies.   Review of Systems Review of Systems  HENT: Negative for facial swelling.   Respiratory: Negative for shortness of breath.   Cardiovascular: Negative for chest pain.    Gastrointestinal: Negative for nausea and vomiting.  Musculoskeletal: Positive for arthralgias.  Skin: Positive for wound.  Neurological: Negative for weakness and numbness.  Psychiatric/Behavioral: Negative for confusion.  All other systems reviewed and are negative.    Physical Exam Updated Vital Signs BP (!) 152/77   Pulse 66   Temp (!) 96.8 F (36 C) (Oral)   Resp 18   Ht 6\' 2"  (1.88 m)   Wt 91.6 kg   SpO2 96%   BMI 25.94 kg/m   Physical Exam  Constitutional: He is oriented to person, place, and time. He appears well-developed and well-nourished. No distress.  HENT:  Head: Normocephalic.  Abrasions to the tip of the nose and upper lip.  Patient's upper dentures are noted to be broken along the gumline.  The patient and his wife can account for the broken pieces.  Lower dentures appear to be intact.  Laceration to the lower lip that appears to be through and through.  The outer portion of the laceration is contained within the vermilion border.  The inner portion of the laceration has a partial avulsion of mucosal tissue.  No other areas of tenderness, swelling, or injury noted to the rest of the face or scalp.   Eyes: Pupils are equal, round, and reactive to light. Conjunctivae and EOM are normal.  Neck: Normal range of motion. Neck supple.  Cardiovascular: Normal rate, regular rhythm, normal heart sounds and intact distal pulses.  Pulses:      Radial pulses are 2+ on the right side, and 2+ on the left side.  Pulmonary/Chest: Effort normal and breath sounds normal. No respiratory distress.  Abdominal: Soft. There is no tenderness. There is no guarding.  Musculoskeletal: He exhibits edema, tenderness and deformity.  Tenderness and some swelling to the right shoulder.  There is a deformity suggestive of shoulder dislocation. Full range of motion without pain or difficulty in the right elbow and wrist.  Normal motor function intact in all other extremities. No midline  spinal tenderness.  No pelvic instability or tenderness.  Motor function intact at the major joints of the lower extremities as well as the left upper extremity.  Overall trauma exam performed with no abnormalities noted other than those mentioned.  Neurological: He is alert and oriented to person, place, and time.  Sensation grossly intact to light touch in the extremities. Strength 5/5 in all extremities. No gait disturbance, tested after patient's head and c-spine were cleared with imaging. Coordination intact. Cranial nerves III-XII grossly intact.    Skin: Skin is warm and dry. He is not diaphoretic.  Psychiatric: He has a normal mood and affect. His behavior is normal.  Nursing note and vitals reviewed.    ED Treatments / Results  Labs (all labs ordered are listed, but only abnormal results are displayed) Labs Reviewed - No data to display  EKG None  Radiology Dg Shoulder  Right  Result Date: 01/11/2018 CLINICAL DATA:  Right shoulder dislocation secondary to a fall. EXAM: RIGHT SHOULDER - 2+ VIEW COMPARISON:  Radiographs dated 10/03/2009 FINDINGS: There is anterior dislocation of the right humeral head. No visible fractures. No abnormal soft tissue calcifications. IMPRESSION: Anterior dislocation of the right humeral head. Electronically Signed   By: Lorriane Shire M.D.   On: 01/11/2018 12:16   Ct Head Wo Contrast  Result Date: 01/11/2018 CLINICAL DATA:  Status post fall with a blow to the face today. Initial encounter. EXAM: CT HEAD WITHOUT CONTRAST CT MAXILLOFACIAL WITHOUT CONTRAST CT CERVICAL SPINE WITHOUT CONTRAST TECHNIQUE: Multidetector CT imaging of the head, cervical spine, and maxillofacial structures were performed using the standard protocol without intravenous contrast. Multiplanar CT image reconstructions of the cervical spine and maxillofacial structures were also generated. COMPARISON:  None. FINDINGS: CT HEAD FINDINGS Brain: No evidence of acute infarction,  hemorrhage, hydrocephalus, extra-axial collection or mass lesion/mass effect. Mild cortical atrophy noted. Vascular: No hyperdense vessel or unexpected calcification. Skull: Intact.  No focal lesion. Other: None. CT MAXILLOFACIAL FINDINGS Osseous: No fracture or mandibular dislocation. No destructive process. Orbits: The globes are intact and lenses are located. Orbital fat is clear. Sinuses: Minimal mucosal thickening is seen in the maxillary sinuses. Soft tissues: Negative. CT CERVICAL SPINE FINDINGS Alignment: Normal. Skull base and vertebrae: No acute fracture. No primary bone lesion or focal pathologic process. Soft tissues and spinal canal: No prevertebral fluid or swelling. No visible canal hematoma. Disc levels:  Loss of disc space height is seen from C3-C6. Upper chest: Lung apices are clear.  Aortic atherosclerosis noted. Other: None. IMPRESSION: No acute abnormality head, face or cervical spine. Mild cortical atrophy. Atherosclerosis. Electronically Signed   By: Inge Rise M.D.   On: 01/11/2018 13:40   Ct Cervical Spine Wo Contrast  Result Date: 01/11/2018 CLINICAL DATA:  Status post fall with a blow to the face today. Initial encounter. EXAM: CT HEAD WITHOUT CONTRAST CT MAXILLOFACIAL WITHOUT CONTRAST CT CERVICAL SPINE WITHOUT CONTRAST TECHNIQUE: Multidetector CT imaging of the head, cervical spine, and maxillofacial structures were performed using the standard protocol without intravenous contrast. Multiplanar CT image reconstructions of the cervical spine and maxillofacial structures were also generated. COMPARISON:  None. FINDINGS: CT HEAD FINDINGS Brain: No evidence of acute infarction, hemorrhage, hydrocephalus, extra-axial collection or mass lesion/mass effect. Mild cortical atrophy noted. Vascular: No hyperdense vessel or unexpected calcification. Skull: Intact.  No focal lesion. Other: None. CT MAXILLOFACIAL FINDINGS Osseous: No fracture or mandibular dislocation. No destructive  process. Orbits: The globes are intact and lenses are located. Orbital fat is clear. Sinuses: Minimal mucosal thickening is seen in the maxillary sinuses. Soft tissues: Negative. CT CERVICAL SPINE FINDINGS Alignment: Normal. Skull base and vertebrae: No acute fracture. No primary bone lesion or focal pathologic process. Soft tissues and spinal canal: No prevertebral fluid or swelling. No visible canal hematoma. Disc levels:  Loss of disc space height is seen from C3-C6. Upper chest: Lung apices are clear.  Aortic atherosclerosis noted. Other: None. IMPRESSION: No acute abnormality head, face or cervical spine. Mild cortical atrophy. Atherosclerosis. Electronically Signed   By: Inge Rise M.D.   On: 01/11/2018 13:40   Dg Shoulder Right Portable  Result Date: 01/11/2018 CLINICAL DATA:  Status post reduction of the right shoulder. EXAM: PORTABLE RIGHT SHOULDER COMPARISON:  Right shoulder radiographs 01/11/2018 at 12:05 p.m. FINDINGS: Right shoulder is reduced. Focal densities subjacent to the glenohumeral joint may reflect fracture fragments or loose bodies. No focal fracture  is evident. Mild degenerative changes are noted in the [facility] joint. Dependent atelectasis is present at the right lung base. IMPRESSION: 1. Interval reduction of the right shoulder. 2. Density subjacent to the joint concerning for fracture fragments or loose bodies. No focal fracture evident. Electronically Signed   By: San Morelle M.D.   On: 01/11/2018 14:53   Ct Maxillofacial Wo Contrast  Result Date: 01/11/2018 CLINICAL DATA:  Status post fall with a blow to the face today. Initial encounter. EXAM: CT HEAD WITHOUT CONTRAST CT MAXILLOFACIAL WITHOUT CONTRAST CT CERVICAL SPINE WITHOUT CONTRAST TECHNIQUE: Multidetector CT imaging of the head, cervical spine, and maxillofacial structures were performed using the standard protocol without intravenous contrast. Multiplanar CT image reconstructions of the cervical spine and  maxillofacial structures were also generated. COMPARISON:  None. FINDINGS: CT HEAD FINDINGS Brain: No evidence of acute infarction, hemorrhage, hydrocephalus, extra-axial collection or mass lesion/mass effect. Mild cortical atrophy noted. Vascular: No hyperdense vessel or unexpected calcification. Skull: Intact.  No focal lesion. Other: None. CT MAXILLOFACIAL FINDINGS Osseous: No fracture or mandibular dislocation. No destructive process. Orbits: The globes are intact and lenses are located. Orbital fat is clear. Sinuses: Minimal mucosal thickening is seen in the maxillary sinuses. Soft tissues: Negative. CT CERVICAL SPINE FINDINGS Alignment: Normal. Skull base and vertebrae: No acute fracture. No primary bone lesion or focal pathologic process. Soft tissues and spinal canal: No prevertebral fluid or swelling. No visible canal hematoma. Disc levels:  Loss of disc space height is seen from C3-C6. Upper chest: Lung apices are clear.  Aortic atherosclerosis noted. Other: None. IMPRESSION: No acute abnormality head, face or cervical spine. Mild cortical atrophy. Atherosclerosis. Electronically Signed   By: Inge Rise M.D.   On: 01/11/2018 13:40    Procedures .Marland KitchenLaceration Repair Date/Time: 01/11/2018 2:26 PM Performed by: Lorayne Bender, PA-C Authorized by: Lorayne Bender, PA-C   Consent:    Consent obtained:  Verbal   Consent given by:  Patient   Risks discussed:  Infection, need for additional repair, poor cosmetic result, pain and poor wound healing Anesthesia (see MAR for exact dosages):    Anesthesia method:  Local infiltration   Local anesthetic:  Lidocaine 2% WITH epi Laceration details:    Location:  Lip   Lip location:  Lower exterior lip   Length (cm):  1 Repair type:    Repair type:  Simple Exploration:    Hemostasis achieved with:  Epinephrine Treatment:    Area cleansed with:  Betadine and saline   Amount of cleaning:  Standard   Irrigation solution:  Sterile saline   Irrigation  method:  Syringe Skin repair:    Repair method:  Sutures   Suture size:  5-0   Suture material:  Prolene   Suture technique:  Simple interrupted   Number of sutures:  3 Approximation:    Approximation:  Loose   Vermilion border: well-aligned   Post-procedure details:    Dressing:  Open (no dressing)   Patient tolerance of procedure:  Tolerated well, no immediate complications  .Marland KitchenLaceration Repair Date/Time: 01/11/2018 2:35 PM Performed by: Lorayne Bender, PA-C Authorized by: Lorayne Bender, PA-C   Consent:    Consent obtained:  Verbal   Consent given by:  Patient   Risks discussed:  Infection, need for additional repair, pain, poor cosmetic result and poor wound healing Anesthesia (see MAR for exact dosages):    Anesthesia method:  Local infiltration   Local anesthetic:  Lidocaine 2% WITH epi Laceration details:  Location:  Lip   Lip location:  Lower interior lip   Length (cm):  2 Repair type:    Repair type:  Intermediate Exploration:    Hemostasis achieved with:  Epinephrine Treatment:    Area cleansed with:  Saline   Amount of cleaning:  Standard   Irrigation solution:  Sterile saline   Irrigation method:  Syringe Subcutaneous repair:    Suture size:  5-0   Suture material:  Vicryl (rapide)   Suture technique:  Simple interrupted (buried)   Number of sutures:  3 Mucous membrane repair:    Suture size:  5-0   Suture material:  Vicryl (rapide)   Suture technique:  Simple interrupted   Number of sutures:  4 Approximation:    Approximation:  Loose Post-procedure details:    Dressing:  Open (no dressing)   Patient tolerance of procedure:  Tolerated well, no immediate complications   (including critical care time)  Medications Ordered in ED Medications  HYDROmorphone (DILAUDID) injection 0.5 mg (0.5 mg Intravenous Given 01/11/18 1115)  HYDROmorphone (DILAUDID) injection 0.5 mg (0.5 mg Intravenous Given 01/11/18 1147)  ondansetron (ZOFRAN) injection 4 mg (4 mg  Intravenous Given 01/11/18 1147)  Tdap (BOOSTRIX) injection 0.5 mL (0.5 mLs Intramuscular Given 01/11/18 1308)  HYDROmorphone (DILAUDID) injection 1 mg (1 mg Intravenous Given 01/11/18 1304)  lidocaine-EPINEPHrine (XYLOCAINE W/EPI) 2 %-1:200000 (PF) injection 10 mL (10 mLs Infiltration Given 01/11/18 1313)  etomidate (AMIDATE) injection 27.48 mg (14 mg Intravenous Given 01/11/18 1409)     Initial Impression / Assessment and Plan / ED Course  I have reviewed the triage vital signs and the nursing notes.  Pertinent labs & imaging results that were available during my care of the patient were reviewed by me and considered in my medical decision making (see chart for details).  Clinical Course as of Jan 13 839  Wed Jan 11, 2018  1521 Spoke with Dr. Stann Mainland, orthopedic surgeon.  He reviewed the pre-and postreduction x-rays, agrees with management plan and office follow-up.   [SJ]    Clinical Course User Index [SJ] Jadiel Schmieder C, PA-C    Patient presents with injuries from a mechanical fall.  Patient sustained shoulder dislocation, neurovascularly intact, successfully reduced at the bedside with sedation, placed in shoulder sling, orthopedic follow-up in the office. He has no acute abnormalities on his CT imaging. The patient was given instructions for home care as well as return precautions. Patient voices understanding of these instructions, accepts the plan, and is comfortable with discharge.  Findings and plan of care discussed with Merrily Pew, MD. Dr. Dayna Barker personally evaluated and examined this patient.   Vitals:   01/11/18 1404 01/11/18 1408 01/11/18 1415 01/11/18 1420  BP: (!) 176/95 (!) 168/88 (!) 179/86 (!) 184/89  Pulse:  60  70  Resp: (!) 22 11 13 14   Temp:      TempSrc:      SpO2:  98%  100%  Weight:      Height:       Hypertension noted.  Patient does not appear to be symptomatic to it at this time.  He will take his home medications after discharge, as prescribed,  and follow-up with his PCP to assure resolution.  Final Clinical Impressions(s) / ED Diagnoses   Final diagnoses:  Anterior dislocation of right shoulder, initial encounter    ED Discharge Orders         Ordered    HYDROcodone-acetaminophen (NORCO/VICODIN) 5-325 MG tablet  Every 6 hours PRN  01/11/18 [place], Mazen Marcin C, PA-C 01/12/18 [id number]    Merrily Pew, MD 01/12/18 1005

## 2018-01-11 NOTE — ED Notes (Signed)
Helped get patient undress on the monitor patient is resting with call bell in reach and family at bedside

## 2018-01-11 NOTE — Sedation Documentation (Signed)
O2 increased to 3L

## 2018-01-11 NOTE — Progress Notes (Signed)
Orthopedic Tech Progress Note Patient Details:  Aria Pickrell 02-17-1932 [id number]  Ortho Devices Type of Ortho Device: Arm sling Ortho Device/Splint Location: applied arm sling post reduction at drs request. Ortho Device/Splint Interventions: Ordered, Application, Adjustment   Post Interventions Patient Tolerated: Well Instructions Provided: Care of device, Adjustment of device   Karolee Stamps 01/11/2018, 3:07 PM

## 2018-01-11 NOTE — Discharge Instructions (Signed)
You have been seen today for a shoulder injury.  There was a dislocation in the shoulder, which was corrected here in the ED. Antiinflammatory medications: Take 400 mg of ibuprofen every 6 hours for the next 3 days. After this time, this medication may be used as needed for pain. Take this medication with food to avoid upset stomach. Acetaminophen (generic for Tylenol): Should you continue to have additional pain while taking the ibuprofen or naproxen, you may add in acetaminophen as needed. Your daily total maximum amount of acetaminophen from all sources should be limited to 4000mg /day for persons without liver problems, or 2000mg /day for those with liver problems. Vicodin: May take Vicodin (hydrocodone-acetaminophen) as needed for severe pain.  Do not drive or perform other dangerous activities while taking the Vicodin.  Please note that each pill of Vicodin contains 325 mg of acetaminophen (Tylenol) and the above dosage limits apply. Ice: May apply ice to the area over the next 24 hours for 15 minutes at a time to reduce swelling. Elevation: Keep the extremity elevated as often as possible to reduce pain and inflammation. Support: Wear the shoulder sling for support and comfort. Wear this until pain resolves.  Follow up: Follow-up with orthopedic specialist within the next 2 weeks.  Call the number provided to set up an appointment. Return: Return to the ED for numbness, weakness, increasing pain, overall worsening symptoms, loss of function, or if symptoms are not improving, you have tried to follow up with the orthopedic specialist, and have been unable to do so.  For prescription assistance, may try using prescription discount sites or apps, such as goodrx.com

## 2018-01-11 NOTE — ED Provider Notes (Signed)
Physical Exam  BP (!) 184/89   Pulse 70   Temp (!) 96.8 F (36 C) (Oral)   Resp 14   Ht 6\' 2"  (1.88 m)   Wt 91.6 kg   SpO2 100%   BMI 25.94 kg/m   Physical Exam  HENT:  Head: Normocephalic.  Gaping wound to inner lower lip Approximately 2 cm laceration to outer lower lip communicates with inner wound  Eyes: Conjunctivae and EOM are normal.  Pulmonary/Chest: Effort normal and breath sounds normal.  Abdominal: Soft. Bowel sounds are normal.  Musculoskeletal: He exhibits tenderness (right shoulder) and deformity.  Neurological: No cranial nerve deficit.  Skin:  Abrasion over nose and anterior upper lip    ED Course/Procedures   Clinical Course as of Jan 12 1005  Wed Jan 11, 2018  1521 Spoke with Dr. Stann Mainland, orthopedic surgeon.  He reviewed the pre-and postreduction x-rays, agrees with management plan and office follow-up.   [SJ]    Clinical Course User Index [SJ] Joy, Shawn C, PA-C    .Sedation Date/Time: 01/11/2018 3:33 PM Performed by: Merrily Pew, MD Authorized by: Merrily Pew, MD   Consent:    Consent obtained:  Verbal   Consent given by:  Patient   Risks discussed:  Allergic reaction, dysrhythmia, inadequate sedation, nausea, prolonged hypoxia resulting in organ damage, prolonged sedation necessitating reversal, respiratory compromise necessitating ventilatory assistance and intubation and vomiting   Alternatives discussed:  Analgesia without sedation, anxiolysis and regional anesthesia Universal protocol:    Procedure explained and questions answered to patient or proxy's satisfaction: yes     Relevant documents present and verified: yes     Test results available and properly labeled: yes     Imaging studies available: yes     Required blood products, implants, devices, and special equipment available: yes     Site/side marked: yes     Immediately prior to procedure a time out was called: yes     Patient identity confirmation method:  Verbally with  patient Indications:    Procedure necessitating sedation performed by:  Physician performing sedation Pre-sedation assessment:    Time since last food or drink:  Unsure   NPO status caution: unable to specify NPO status     ASA classification: class 1 - normal, healthy patient     Neck mobility: normal     Mouth opening:  3 or more finger widths   Thyromental distance:  4 finger widths   Mallampati score:  I - soft palate, uvula, fauces, pillars visible   Pre-sedation assessments completed and reviewed: airway patency, cardiovascular function, hydration status, mental status, nausea/vomiting, pain level, respiratory function and temperature   Immediate pre-procedure details:    Reassessment: Patient reassessed immediately prior to procedure     Reviewed: vital signs, relevant labs/tests and NPO status     Verified: bag valve mask available, emergency equipment available, intubation equipment available, IV patency confirmed, oxygen available and suction available   Procedure details (see MAR for exact dosages):    Preoxygenation:  Nasal cannula   Sedation:  Etomidate   Intra-procedure monitoring:  Blood pressure monitoring, cardiac monitor, continuous pulse oximetry, frequent LOC assessments, frequent vital sign checks and continuous capnometry   Intra-procedure events: none     Total Provider sedation time (minutes):  30 Post-procedure details:    Attendance: Constant attendance by certified staff until patient recovered     Recovery: Patient returned to pre-procedure baseline     Post-sedation assessments completed and reviewed: airway patency,  cardiovascular function, hydration status, mental status, nausea/vomiting, pain level, respiratory function and temperature     Patient is stable for discharge or admission: yes     Patient tolerance:  Tolerated well, no immediate complications  .Ortho Injury Treatment Date/Time: 01/11/2018 4:06 PM Performed by: Merrily Pew, MD Authorized  by: Merrily Pew, MD   Consent:    Consent obtained:  Verbal   Consent given by:  Patient   Risks discussed:  Fracture, irreducible dislocation, stiffness, restricted joint movement and recurrent dislocation   Alternatives discussed:  No treatment, alternative treatment and immobilizationInjury location: shoulder Location details: right shoulder Injury type: dislocation Dislocation type: anterior Hill-Sachs deformity: no Chronicity: new Pre-procedure neurovascular assessment: neurovascularly intact Pre-procedure distal perfusion: normal Pre-procedure neurological function: normal Pre-procedure range of motion: reduced  Anesthesia: Local anesthesia used: no  Patient sedated: Yes. Refer to sedation procedure documentation for details of sedation. Manipulation performed: yes Reduction method: external rotation and traction and counter traction Reduction successful: yes X-ray confirmed reduction: yes Splint type: sling. Post-procedure neurovascular assessment: post-procedure neurovascularly intact Post-procedure distal perfusion: normal Post-procedure neurological function: normal Post-procedure range of motion: improved Patient tolerance: Patient tolerated the procedure well with no immediate complications     MDM   Patient with a basically mechanical fall sustaining a right shoulder dislocation, multiple abrasions to his face, laceration to his lower lip that is through and through requiring repair.  Not tolerating pain with pain medicine so will need sedation.  With sedation I was able to reduce the patient's shoulder while the physician assistant repaired the complex laceration to the patient's lip.  Patient with pain and swelling afterwards but significantly improved position.       Merrily Pew, MD 01/12/18 1006
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
## 2018-06-05 ENCOUNTER — Other Ambulatory Visit: Payer: Self-pay

## 2018-06-05 DIAGNOSIS — I714 Abdominal aortic aneurysm, without rupture, unspecified: Secondary | ICD-10-CM

## 2018-06-19 ENCOUNTER — Ambulatory Visit: Payer: Medicare Other | Admitting: Family

## 2018-06-19 ENCOUNTER — Other Ambulatory Visit (HOSPITAL_COMMUNITY): Payer: Medicare Other

## 2018-06-19 ENCOUNTER — Encounter (HOSPITAL_COMMUNITY): Payer: Medicare Other

## 2018-09-22 ENCOUNTER — Ambulatory Visit (INDEPENDENT_AMBULATORY_CARE_PROVIDER_SITE_OTHER)
Admission: RE | Admit: 2018-09-22 | Discharge: 2018-09-22 | Disposition: A | Discharge status: Home / Self Care | Payer: Medicare Other | Source: Ambulatory Visit | Attending: Family | Admitting: Family

## 2018-09-22 ENCOUNTER — Encounter: Payer: Self-pay | Admitting: Family

## 2018-09-22 ENCOUNTER — Ambulatory Visit (HOSPITAL_COMMUNITY)
Admission: RE | Admit: 2018-09-22 | Discharge: 2018-09-22 | Disposition: A | Discharge status: Home / Self Care | Payer: Medicare Other | Source: Ambulatory Visit | Attending: Family | Admitting: Family

## 2018-09-22 ENCOUNTER — Other Ambulatory Visit: Payer: Self-pay

## 2018-09-22 ENCOUNTER — Ambulatory Visit (INDEPENDENT_AMBULATORY_CARE_PROVIDER_SITE_OTHER): Payer: Medicare Other | Admitting: Family

## 2018-09-22 VITALS — BP 161/78 | HR 56 | Temp 97.9°F | Resp 20 | Ht 74.0 in | Wt 204.5 lb

## 2018-09-22 DIAGNOSIS — Z95828 Presence of other vascular implants and grafts: Secondary | ICD-10-CM | POA: Diagnosis not present

## 2018-09-22 DIAGNOSIS — I714 Abdominal aortic aneurysm, without rupture, unspecified: Secondary | ICD-10-CM

## 2018-09-22 DIAGNOSIS — I779 Disorder of arteries and arterioles, unspecified: Secondary | ICD-10-CM | POA: Diagnosis not present

## 2018-09-22 DIAGNOSIS — R0989 Other specified symptoms and signs involving the circulatory and respiratory systems: Secondary | ICD-10-CM | POA: Diagnosis not present

## 2018-09-22 NOTE — Patient Instructions (Signed)
Before your next abdominal ultrasound:  Avoid gas forming foods and beverages the day before the test.   Take two Extra-Strength Gas-X capsules at bedtime the night before the test. Take another two Extra-Strength Gas-X capsules in the middle of the night if you get up to the restroom, if not, first thing in the morning with water.  Do not chew gum.      Peripheral Vascular Disease  Peripheral vascular disease (PVD) is a disease of the blood vessels that are not part of your heart and brain. A simple term for PVD is poor circulation. In most cases, PVD narrows the blood vessels that carry blood from your heart to the rest of your body. This can reduce the supply of blood to your arms, legs, and internal organs, like your stomach or kidneys. However, PVD most often affects a person's lower legs and feet. Without treatment, PVD tends to get worse. PVD can also lead to acute ischemic limb. This is when an arm or leg suddenly cannot get enough blood. This is a medical emergency. Follow these instructions at home: Lifestyle  Do not use any products that contain nicotine or tobacco, such as cigarettes and e-cigarettes. If you need help quitting, ask your doctor.  Lose weight if you are overweight. Or, stay at a healthy weight as told by your doctor.  Eat a diet that is low in fat and cholesterol. If you need help, ask your doctor.  Exercise regularly. Ask your doctor for activities that are right for you. General instructions  Take over-the-counter and prescription medicines only as told by your doctor.  Take good care of your feet: ? Wear comfortable shoes that fit well. ? Check your feet often for any cuts or sores.  Keep all follow-up visits as told by your doctor This is important. Contact a doctor if:  You have cramps in your legs when you walk.  You have leg pain when you are at rest.  You have coldness in a leg or foot.  Your skin changes.  You are unable to get or have an  erection (erectile dysfunction).  You have cuts or sores on your feet that do not heal. Get help right away if:  Your arm or leg turns cold, numb, and blue.  Your arms or legs become red, warm, swollen, painful, or numb.  You have chest pain.  You have trouble breathing.  You suddenly have weakness in your face, arm, or leg.  You become very confused or you cannot speak.  You suddenly have a very bad headache.  You suddenly cannot see. Summary  Peripheral vascular disease (PVD) is a disease of the blood vessels.  A simple term for PVD is poor circulation. Without treatment, PVD tends to get worse.  Treatment may include exercise, low fat and low cholesterol diet, and quitting smoking. This information is not intended to replace advice given to you by your health care provider. Make sure you discuss any questions you have with your health care provider. Document Released: 04/28/2009 Document Revised: 01/14/2017 Document Reviewed: 03/11/2016 Elsevier Patient Education  2020 [employer].

## 2018-09-22 NOTE — Progress Notes (Signed)
[facility] [place]  CC: Follow up s/p Endovascular Repair of Abdominal Aortic Aneurysm    History of Present Illness  Anthony Harmon is a 85 y.o. ([birth year]) male who is s/p endovascular aneurysm repair for 5 cm aneurysm on 05-12-17 by Dr. Donzetta Matters.  He has known chronic lumbar spine problems, states localized low back pain is worse in the morning and with sitting.  He denies pain or weakness in the muscles of his legs with walking. He does seem to have sciatic type symptoms in both legs with walking, does not seem to be claudication type sx's.  After mowing a self propelled mower for a while his left anterior thigh is painful, relieved by sitting a few minutes.  He denies any known hx of stroke or TIA.   He denies headache, denies chest pain, denies dyspnea. He does not check his blood pressure at home.   Pt states he did not know that long term and regular use of ibuprofen could damage his kidneys, he has been taking Advil for 3-4 months, one of his diagnoses is CKD. His blood pressure is elevated now.  Pt states he has been offered injections in his back to help his pain.   Diabetic: No Tobaccos use: former smoker, quit in 2017, smoked x 20 years   Past Medical History:  Diagnosis Date  . AAA (abdominal aortic aneurysm) ([place])   . Arthritis   . CKD (chronic kidney disease)    stage III 02/2017  . GERD (gastroesophageal reflux disease)   . High cholesterol   . Hypertension   . Hypothyroidism   . Prostate cancer [facility])    s/p brachytherapy 08/16/07   Past Surgical History:  Procedure Laterality Date  . ABDOMINAL AORTIC ENDOVASCULAR STENT GRAFT N/A 05/12/2017   Procedure: ABDOMINAL AORTIC ENDOVASCULAR STENT GRAFT;  Surgeon: Waynetta Sandy, MD;  Location: [place];  Service: Vascular;  Laterality: N/A;  . None     Social History Social History   Tobacco Use  . Smoking status: Former Smoker    Packs/day: 0.25    Years: 20.00    Pack years: 5.00     Types: Cigarettes    Quit date: 04/23/2015    Years since quitting: 3.4  . Smokeless tobacco: Never Used  Substance Use Topics  . Alcohol use: No  . Drug use: No   Family History History reviewed. No pertinent family history. Current Outpatient Medications on File Prior to Visit  Medication Sig Dispense Refill  . amLODipine (NORVASC) 5 MG tablet Take 5 mg by mouth daily.     Marland Kitchen aspirin EC 81 MG tablet Take 81 mg by mouth daily.    . Cholecalciferol (VITAMIN D3 PO) Take 1 capsule by mouth daily.    Marland Kitchen HYDROcodone-acetaminophen (NORCO/VICODIN) 5-325 MG tablet hydrocodone 5 mg-acetaminophen 325 mg tablet  Take 1 tablet every 6 hours by oral route.    Marland Kitchen ibuprofen (ADVIL,MOTRIN) 200 MG tablet Take 200 mg by mouth every 6 (six) hours as needed for headache or moderate pain.    Marland Kitchen levothyroxine (SYNTHROID, LEVOTHROID) 75 MCG tablet Take 75 mcg by mouth daily.     . Menthol, Topical Analgesic, (ICY HOT EX) Apply 1 application topically daily as needed (for back pain).    . pravastatin (PRAVACHOL) 40 MG tablet Take 40 mg by mouth at bedtime.  1  . traZODone (DESYREL) 50 MG tablet Take 50 mg by mouth at bedtime as needed for sleep.  No current facility-administered medications on file prior to visit.    No Known Allergies   ROS: See HPI for pertinent positives and negatives.  Physical Examination  Vitals:   09/22/18 0942  BP: (!) 161/78  Pulse: (!) 56  Resp: 20  Temp: 97.9 F (36.6 C)  SpO2: 100%  Weight: 204 lb 8 oz (92.8 kg)  Height: 6\' 2"  (1.88 m)   Body mass index is 26.26 kg/m.  General: A&O x 3, WD, fit appearing mael HEENT: No gross abnormalities  Pulmonary: Sym exp, respirations are non labored, good air movement in all fields CTAB, no rales, rhonchi, or wheezes Cardiac: Regular rhythm and rate, no murmur appreciated  Vascular: Vessel Right Left  Radial 2+Palpable 2+Palpable  Carotid  without bruit  without bruit  Aorta Not palpable N/A  Femoral 2+Palpable  2+Palpable  Popliteal 1+ palpable Not palpable  PT Not Palpable Not Palpable  DP Faintly Palpable Not Palpable   Gastrointestinal: soft, NTND, -G/R, - HSM, - palpable masses, - CVAT B. Musculoskeletal: M/S 5/5 throughout, extremities without ischemic changes Skin: No rashes, no ulcers, no cellulitis.   Neurologic: Pain and light touch intact in extremities , Motor exam as listed above. CN 2-12 grossly intact except he has noticeable hearing loss.  Psychiatric: Normal thought content, mood appropriate for clinical situation.    DATA  EVAR Duplex   Previoust (Date: 12-19-17)  AAA sac size: 3.8 cm; Right CIA: 1.4 cm; Left CIA: 1.6 cm  no endoleak detected   Current (Date: 09-22-18) Endovascular Aortic Repair (EVAR): +----------+----------------+-------------------+-------------------+           Diameter AP (cm)Diameter Trans (cm)Velocities (cm/sec) +----------+----------------+-------------------+-------------------+ Aorta     3.37            3.58               59                  +----------+----------------+-------------------+-------------------+ Right Limb1.54            1.75               64                  +----------+----------------+-------------------+-------------------+ Left Limb 1.77            1.71               60                  +----------+----------------+-------------------+-------------------+  Summary: Abdominal Aorta: Patent endovascular aneurysm repair with no evidence of endoleak.   ABI (Date: 09/22/2018): ABI Findings: +---------+------------------+-----+---------+--------+ Right    Rt Pressure (mmHg)IndexWaveform Comment  +---------+------------------+-----+---------+--------+ Brachial 167                                      +---------+------------------+-----+---------+--------+ ATA      157               0.94 triphasic         +---------+------------------+-----+---------+--------+ PTA      163                0.98 triphasic         +---------+------------------+-----+---------+--------+ Great Toe183               1.10                   +---------+------------------+-----+---------+--------+  +---------+------------------+-----+----------+-------+  Left     Lt Pressure (mmHg)IndexWaveform  Comment +---------+------------------+-----+----------+-------+ Brachial 166                                      +---------+------------------+-----+----------+-------+ ATA      93                0.56 monophasic        +---------+------------------+-----+----------+-------+ PTA      92                0.55 monophasic        +---------+------------------+-----+----------+-------+ Great Toe98                0.59                   +---------+------------------+-----+----------+-------+  +-------+-----------+-----------+------------+------------+ ABI/TBIToday's ABIToday's TBIPrevious ABIPrevious TBI +-------+-----------+-----------+------------+------------+ Right  0.98       1.1                                 +-------+-----------+-----------+------------+------------+ Left   0.56       0.59                                +-------+-----------+-----------+------------+------------+  No previous ABI.   Summary: Right: Resting right ankle-brachial index is within normal range. No evidence of significant right lower extremity arterial disease. The right toe-brachial index is normal. RT great toe pressure = 183 mmHg.  Left: Resting left ankle-brachial index indicates moderate left lower extremity arterial disease. The left toe-brachial index is abnormal. LT Great toe pressure = 98 mmHg   Medical Decision Making  Anthony Harmon is a 85 y.o. male who is s/p  EVAR (Date: 05-12-17).  Pt is asymptomatic with decreased sac size to 3.6 cm from 3.8 cm on 12-29-17.  Pt states he did not know that long term and regular use of ibupuprofen could dmamge his  kidneys, he has been taking advil for months, one of his diagnoses is CKD.  His blood pressure is also elevated now. I advise him to check his blood pressure at home, or in a pharmacy, and to see his PCP about his blood pressure and another form of pain relief for his chronic back pain. We discussed that he may take OTC Tylenol for pain instead.   PAOD: left thigh pain when mowing may be a combination of his known lumbar spine issues and peripheral artery occlusive disease.  ABI's today show normal perfusion in his right leg with triphasic waveforms, and moderate diease in his left leg with monophasic waveforms. This is the first ABI result on file.  Continue walking a total of at least 30 minutes daily in a safe environment, and minimizing his atherosclerotic risk factors. He does not have DM and he quit smoking in 2017.  He takes a daily 81 mg ASA and a statin.   I discussed with the patient the importance of surveillance of the endograft.  The next endograft duplex will be scheduled for 12 months, will also check ABI's.   I emphasized the importance of maximal medical management including strict control of blood pressure, blood glucose, and lipid levels, antiplatelet agents, obtaining regular exercise, and cessation of smoking.   Thank you for allowing [place] to participate in this patient's  care.  Clemon Chambers, RN, MSN, FNP-C [facility] [place] Office: [facility] Physician: Trula Slade on call  09/22/2018, 10:22 AM

## 2018-11-03 ENCOUNTER — Other Ambulatory Visit: Payer: Self-pay | Admitting: Nephrology

## 2018-11-03 DIAGNOSIS — N183 Chronic kidney disease, stage 3 unspecified: Secondary | ICD-10-CM

## 2018-11-08 ENCOUNTER — Ambulatory Visit
Admission: RE | Admit: 2018-11-08 | Discharge: 2018-11-08 | Disposition: A | Discharge status: Home / Self Care | Payer: Medicare Other | Source: Ambulatory Visit | Attending: Nephrology | Admitting: Nephrology

## 2018-11-08 DIAGNOSIS — N183 Chronic kidney disease, stage 3 unspecified: Secondary | ICD-10-CM

## 2019-08-09 ENCOUNTER — Ambulatory Visit: Payer: Medicare (Managed Care) | Admitting: Podiatry

## 2019-08-09 ENCOUNTER — Other Ambulatory Visit: Payer: Self-pay

## 2019-08-09 DIAGNOSIS — M79674 Pain in right toe(s): Secondary | ICD-10-CM

## 2019-08-09 DIAGNOSIS — L600 Ingrowing nail: Secondary | ICD-10-CM

## 2019-08-09 DIAGNOSIS — I739 Peripheral vascular disease, unspecified: Secondary | ICD-10-CM

## 2019-08-09 DIAGNOSIS — B351 Tinea unguium: Secondary | ICD-10-CM | POA: Diagnosis not present

## 2019-08-10 ENCOUNTER — Telehealth: Payer: Self-pay | Admitting: *Deleted

## 2019-08-10 DIAGNOSIS — I739 Peripheral vascular disease, unspecified: Secondary | ICD-10-CM

## 2019-08-10 DIAGNOSIS — M79674 Pain in right toe(s): Secondary | ICD-10-CM

## 2019-08-10 NOTE — Telephone Encounter (Signed)
-----   Message from Trula Slade, DPM sent at 08/10/2019  7:28 AM EDT ----- Can you please order arterial studies? Thanks.

## 2019-08-10 NOTE — Telephone Encounter (Signed)
Faxed required form, clinicals, with demographics to [facility].

## 2019-08-10 NOTE — Progress Notes (Signed)
Subjective:   Patient ID: Anthony Harmon, male   DOB: 86 y.o.   MRN: [id number]   HPI 86 year old male presents the office today for concerns of tenderness to his right big toenail, medial aspect of the distal aspect which is been ongoing for last 2 to 3 weeks.  Denies any drainage or pus or any swelling or redness.  Is tender with pressure in shoes.  He also does some occasional burning to his feet and this is ongoing for last 1 week.  Denies any recent injury or falls and no radiating pain or weakness.  It is intermittent only at nighttime usually.   Review of Systems  All other systems reviewed and are negative.  Past Medical History:  Diagnosis Date  . AAA (abdominal aortic aneurysm) ([place])   . Arthritis   . CKD (chronic kidney disease)    stage III 02/2017  . GERD (gastroesophageal reflux disease)   . High cholesterol   . Hypertension   . Hypothyroidism   . Prostate cancer [facility])    s/p brachytherapy 08/16/07    Past Surgical History:  Procedure Laterality Date  . ABDOMINAL AORTIC ENDOVASCULAR STENT GRAFT N/A 05/12/2017   Procedure: ABDOMINAL AORTIC ENDOVASCULAR STENT GRAFT;  Surgeon: Waynetta Sandy, MD;  Location: [place];  Service: Vascular;  Laterality: N/A;  . None       Current Outpatient Medications:  .  amLODipine (NORVASC) 10 MG tablet, Take 10 mg by mouth daily., Disp: , Rfl:  .  amLODipine (NORVASC) 5 MG tablet, Take 5 mg by mouth daily. , Disp: , Rfl:  .  aspirin EC 81 MG tablet, Take 81 mg by mouth daily., Disp: , Rfl:  .  Cholecalciferol (VITAMIN D3 PO), Take 1 capsule by mouth daily., Disp: , Rfl:  .  hydrALAZINE (APRESOLINE) 10 MG tablet, Take 10 mg by mouth 3 (three) times daily., Disp: , Rfl:  .  HYDROcodone-acetaminophen (NORCO/VICODIN) 5-325 MG tablet, hydrocodone 5 mg-acetaminophen 325 mg tablet  Take 1 tablet every 6 hours by oral route., Disp: , Rfl:  .  ibuprofen (ADVIL,MOTRIN) 200 MG tablet, Take 200 mg by mouth every 6 (six) hours as needed  for headache or moderate pain., Disp: , Rfl:  .  levothyroxine (SYNTHROID, LEVOTHROID) 75 MCG tablet, Take 75 mcg by mouth daily. , Disp: , Rfl:  .  losartan (COZAAR) 25 MG tablet, Take 25 mg by mouth daily., Disp: , Rfl:  .  Menthol, Topical Analgesic, (ICY HOT EX), Apply 1 application topically daily as needed (for back pain)., Disp: , Rfl:  .  pravastatin (PRAVACHOL) 40 MG tablet, Take 40 mg by mouth at bedtime., Disp: , Rfl: 1 .  traZODone (DESYREL) 50 MG tablet, Take 50 mg by mouth at bedtime as needed for sleep. , Disp: , Rfl:   No Known Allergies        Objective:  Physical Exam  General: AAO x3, NAD  Dermatological: Nails appear to be hypertrophic, dystrophic with yellow-brown discoloration and there is mild incurvation present to the medial aspect distal in the right hallux toenail.  There is no edema, erythema, drainage or pus or any signs of infection noted today.  No open lesions otherwise.  Vascular: Dorsalis Pedis artery and Posterior Tibial artery pedal pulses are decreased bilateral.There is no pain with calf compression, swelling, warmth, erythema.   Neruologic: Grossly intact via light touch bilateral.  Sensation intact with Semmes Weinstein monofilament  Musculoskeletal: Other than the right hallux toenail no other areas of  tenderness muscular strength 5/5 in all groups tested bilateral.  Gait: Unassisted, Nonantalgic.       Assessment:   Right medial hallux ingrown toenail without signs of infection; concern for PAD     Plan:  -Treatment options discussed including all alternatives, risks, and complications -Etiology of symptoms were discussed -I discussed the partial nail avulsion with chemical matricectomy however given concern for PAD I did ABI in the office.  The left side was read as "PAD" in the right was 1.01.  Order arterial studies -I sharply debrided the right hallux toenail any complications or bleeding.  After debridement there is resolution of  pain.   No follow-ups on file.  Trula Slade DPM

## 2019-10-11 ENCOUNTER — Telehealth: Payer: Self-pay | Admitting: *Deleted

## 2019-10-11 NOTE — Telephone Encounter (Signed)
[name] from vein and vascular called and state that they have tried to call patient at least three different times and there is no answer. Anthony Harmon

## 2020-03-06 ENCOUNTER — Other Ambulatory Visit: Payer: Self-pay | Admitting: Internal Medicine

## 2020-03-07 LAB — COMPLETE METABOLIC PANEL WITH GFR
AG Ratio: 1.4 (calc) (ref 1.0–2.5)
ALT: 11 U/L (ref 9–46)
AST: 22 U/L (ref 10–35)
Albumin: 4.5 g/dL (ref 3.6–5.1)
Alkaline phosphatase (APISO): 62 U/L (ref 35–144)
BUN/Creatinine Ratio: 14 (calc) (ref 6–22)
BUN: 38 mg/dL — ABNORMAL HIGH (ref 7–25)
CO2: 18 mmol/L — ABNORMAL LOW (ref 20–32)
Calcium: 9.6 mg/dL (ref 8.6–10.3)
Chloride: 106 mmol/L (ref 98–110)
Creat: 2.66 mg/dL — ABNORMAL HIGH (ref 0.70–1.11)
GFR, Est African American: 24 mL/min/{1.73_m2} — ABNORMAL LOW (ref >=60)
GFR, Est Non African American: 21 mL/min/{1.73_m2} — ABNORMAL LOW (ref >=60)
Globulin: 3.2 g/dL (calc) (ref 1.9–3.7)
Glucose, Bld: 63 mg/dL — ABNORMAL LOW (ref 65–99)
Potassium: 4.8 mmol/L (ref 3.5–5.3)
Sodium: 141 mmol/L (ref 135–146)
Total Bilirubin: 0.5 mg/dL (ref 0.2–1.2)
Total Protein: 7.7 g/dL (ref 6.1–8.1)

## 2020-03-07 LAB — CBC
HCT: 35.8 % — ABNORMAL LOW (ref 38.5–50.0)
Hemoglobin: 11.9 g/dL — ABNORMAL LOW (ref 13.2–17.1)
MCH: 31.3 pg (ref 27.0–33.0)
MCHC: 33.2 g/dL (ref 32.0–36.0)
MCV: 94.2 fL (ref 80.0–100.0)
MPV: 11.4 fL (ref 7.5–12.5)
Platelets: 207 10*3/uL (ref 140–400)
RBC: 3.8 10*6/uL — ABNORMAL LOW (ref 4.20–5.80)
RDW: 11.6 % (ref 11.0–15.0)
WBC: 5.6 10*3/uL (ref 3.8–10.8)

## 2020-03-07 LAB — TSH: TSH: 25.75 mIU/L — ABNORMAL HIGH (ref 0.40–4.50)

## 2020-03-07 LAB — LIPID PANEL
Cholesterol: 199 mg/dL (ref <200)
HDL: 40 mg/dL (ref >=40)
LDL Cholesterol (Calc): 125 mg/dL (calc) — ABNORMAL HIGH
Non-HDL Cholesterol (Calc): 159 mg/dL (calc) — ABNORMAL HIGH (ref <130)
Total CHOL/HDL Ratio: 5 (calc) — ABNORMAL HIGH (ref <5.0)
Triglycerides: 225 mg/dL — ABNORMAL HIGH (ref <150)

## 2020-03-07 LAB — T4, FREE: Free T4: 0.9 ng/dL (ref 0.8–1.8)

## 2020-07-22 ENCOUNTER — Ambulatory Visit (INDEPENDENT_AMBULATORY_CARE_PROVIDER_SITE_OTHER): Payer: Medicare Other | Admitting: Podiatry

## 2020-07-22 ENCOUNTER — Other Ambulatory Visit: Payer: Self-pay

## 2020-07-22 DIAGNOSIS — L600 Ingrowing nail: Secondary | ICD-10-CM | POA: Diagnosis not present

## 2020-07-22 DIAGNOSIS — M79676 Pain in unspecified toe(s): Secondary | ICD-10-CM

## 2020-07-22 NOTE — Patient Instructions (Signed)

## 2020-08-15 NOTE — Progress Notes (Signed)
  Subjective:  Patient ID: Everardo Voris, male    DOB: 21-Jun-1932,  MRN: [id number]  Chief Complaint  Patient presents with   Ingrown Toenail    Right great toe nail that has been ingrown for 6+ months.     87 y.o. male presents with the above complaint. History confirmed with patient.   Objective:  Physical Exam: warm, good capillary refill, no trophic changes or ulcerative lesions, normal DP and PT pulses, and normal sensory exam.  Painful ingrowing nail at  medial border of the right, hallux; without warmth, erythema or drainage  Assessment:   1. Ingrown toenail   2. Pain around toenail      Plan:  Patient was evaluated and treated and all questions answered.  Ingrown Nail, right -Palliative debridement of ingrowing nails in slant-back fashion to patient relief. -Okay to soak daily and then apply antibiotic ointment and Band-Aid after soaking

## 2021-02-18 DIAGNOSIS — H40033 Anatomical narrow angle, bilateral: Secondary | ICD-10-CM | POA: Diagnosis not present

## 2021-02-18 DIAGNOSIS — H524 Presbyopia: Secondary | ICD-10-CM | POA: Diagnosis not present

## 2021-02-18 DIAGNOSIS — H2513 Age-related nuclear cataract, bilateral: Secondary | ICD-10-CM | POA: Diagnosis not present

## 2021-04-11 IMAGING — US US RENAL
1 series · 14 of 25 positions shown · non-contrast
Comparison: None.

CLINICAL DATA: renal disease.  Stage III.  No hematuria

EXAM:
RENAL / URINARY TRACT ULTRASOUND COMPLETE

[Series 1: us renal · 0.26mm/px · 14 of 29 slices shown]
[im 1/29]
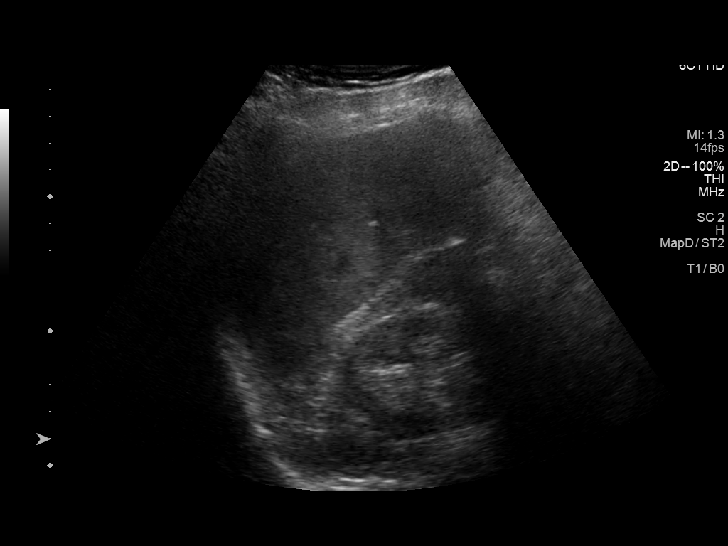
[im 3/29]
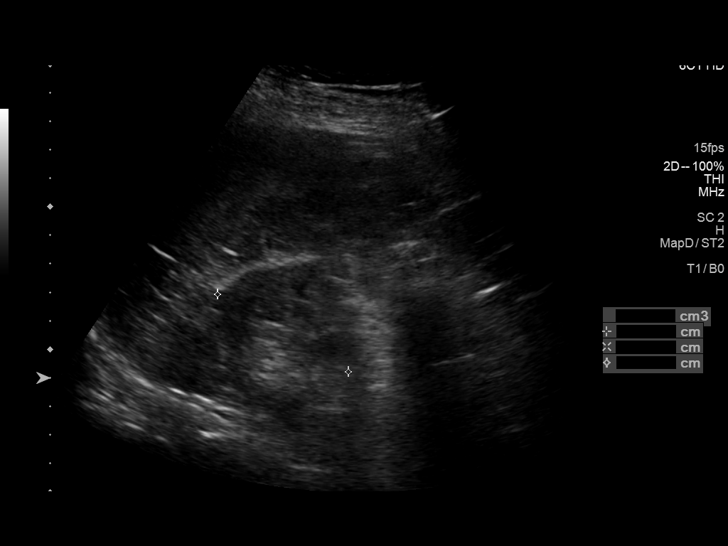
[im 5/29]
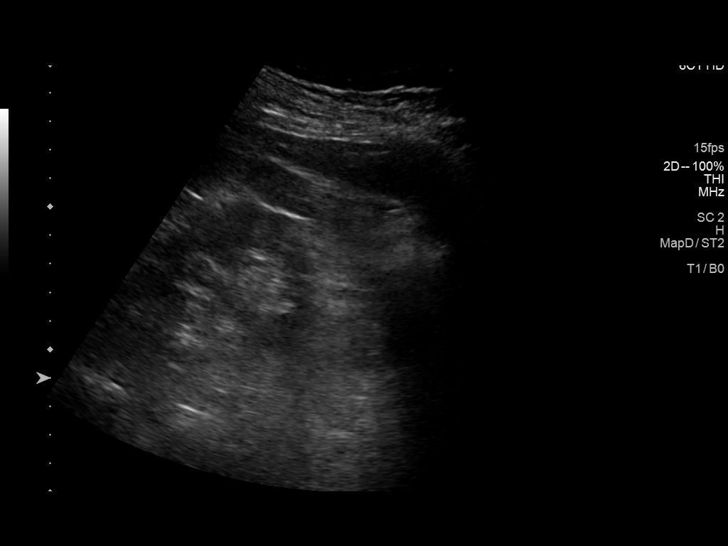
[im 8/29]
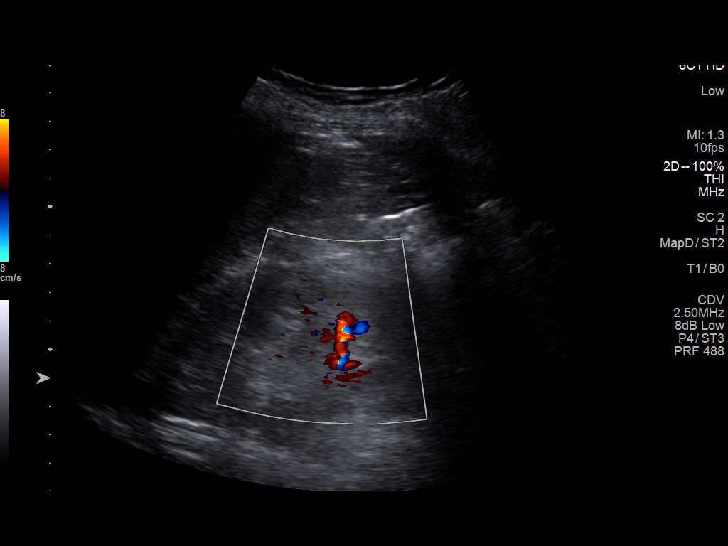
[im 10/29]
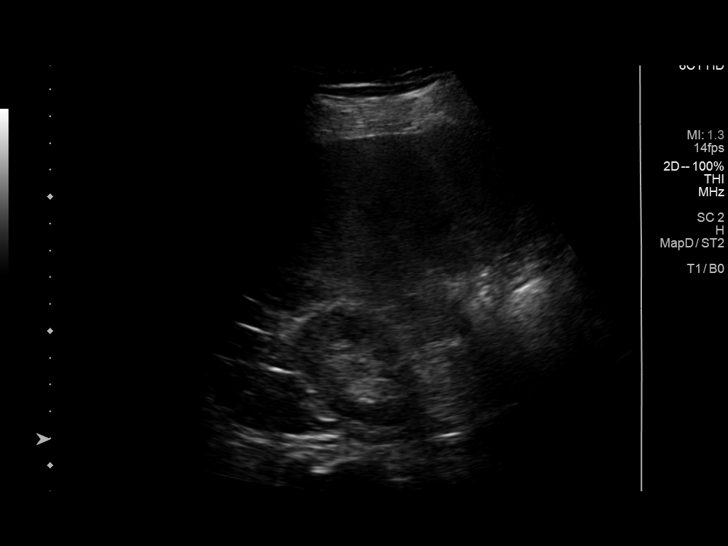
[im 11/29]
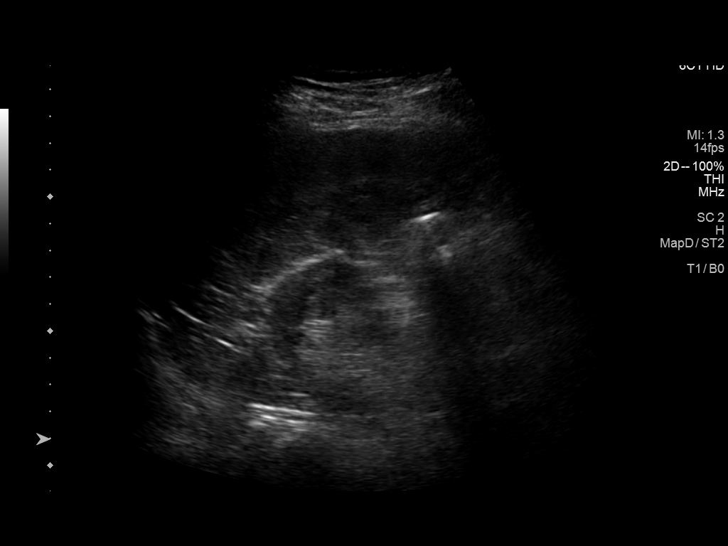
[im 13/29]
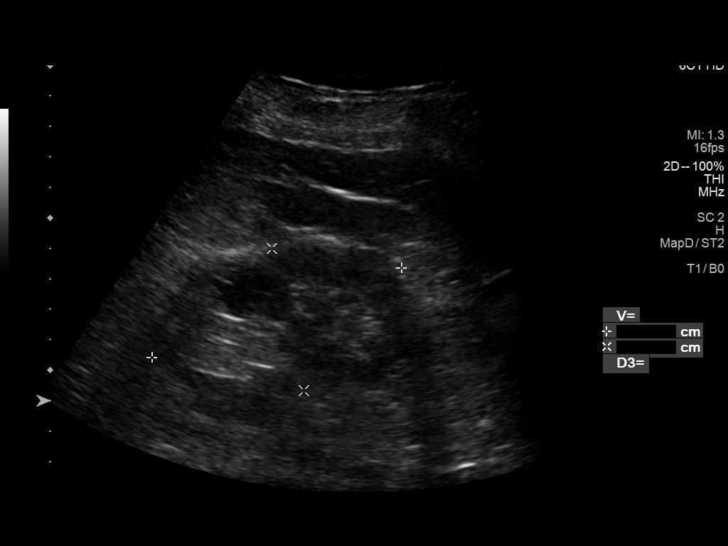
[im 16/29]
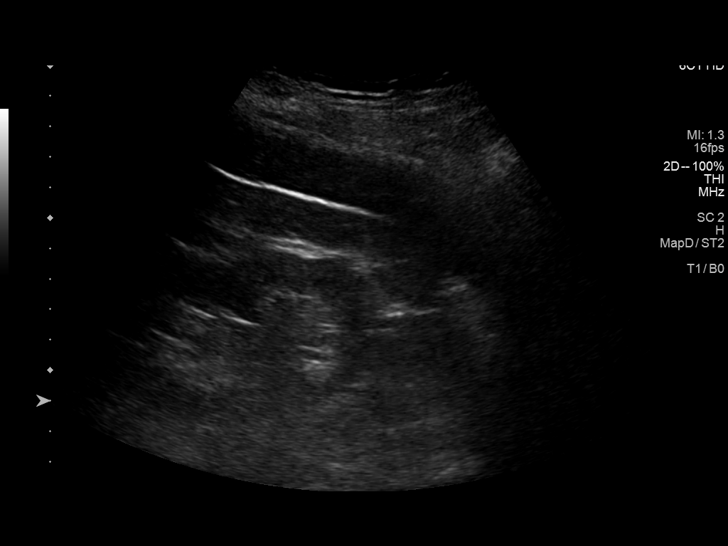
[im 18/29]
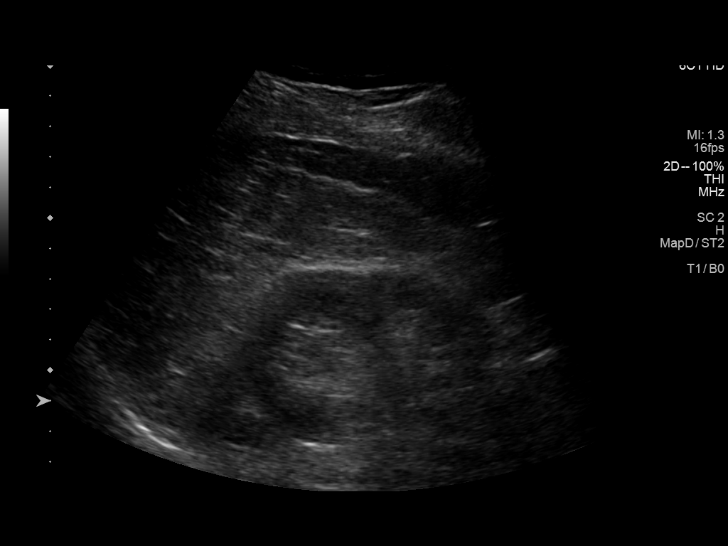
[im 19/29]
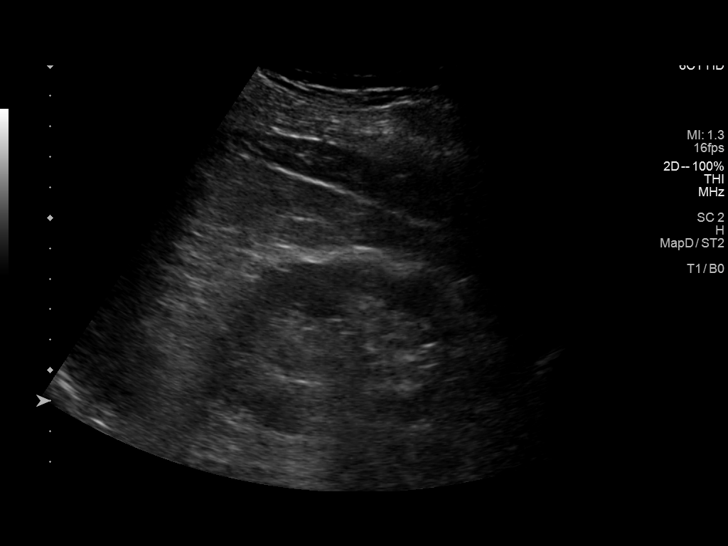
[im 22/29]
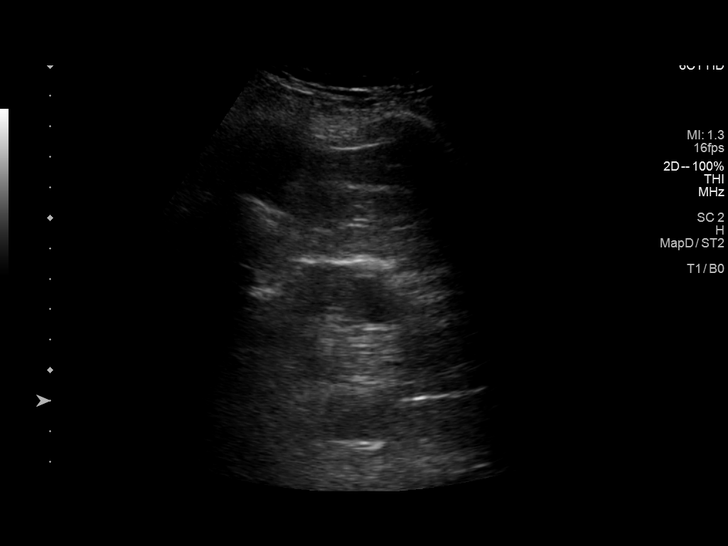
[im 24/29]
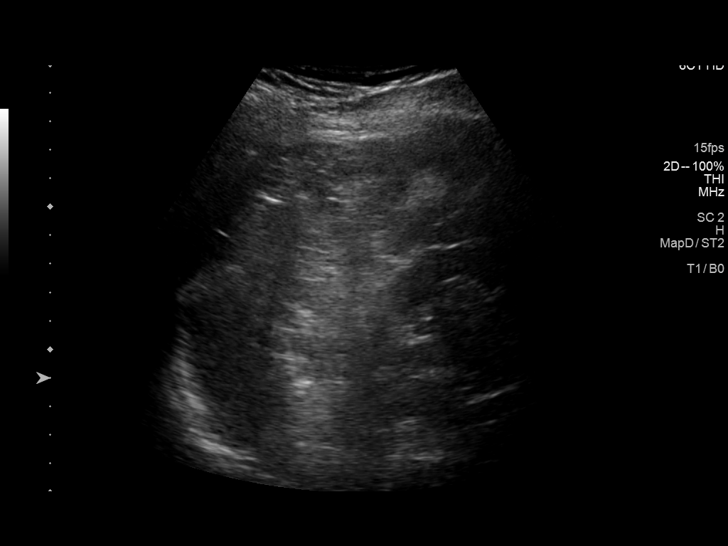
[im 26/29]
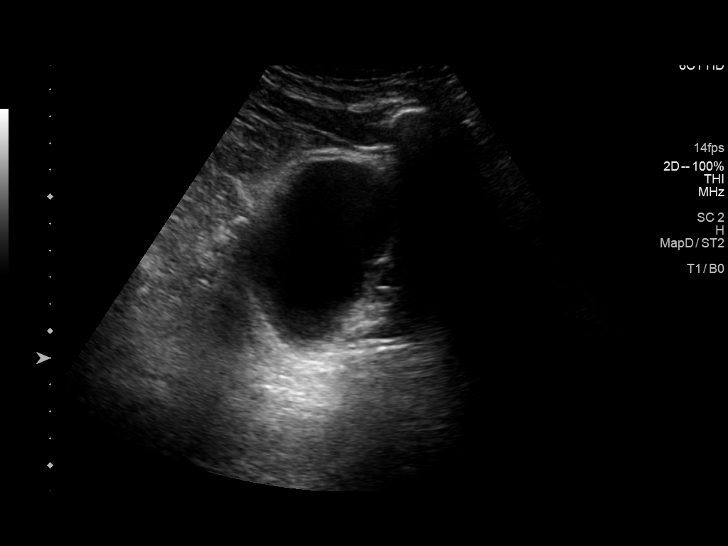
[im 29/29]
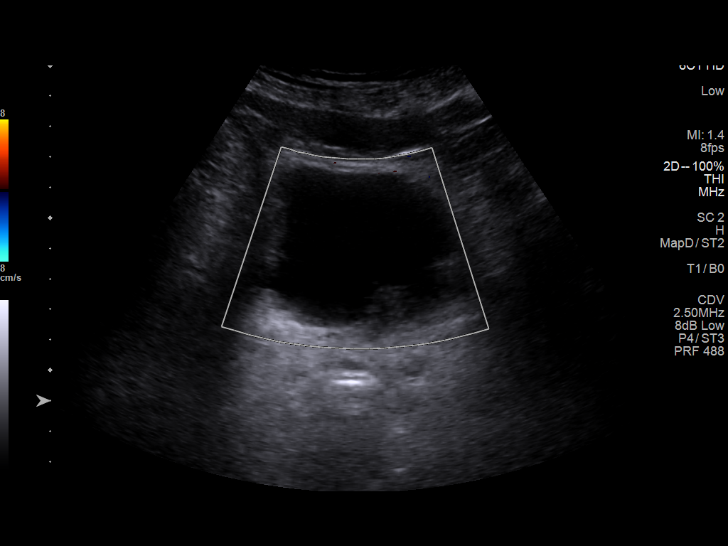

[14 of 25 positions shown; findings below may reference images not displayed]

FINDINGS: Right Kidney:

Renal measurements: 9.4 x 5.9 x 5.3 cm = volume: 153 mL .
Echogenicity within normal limits. No mass or hydronephrosis
visualized.

Left Kidney:

Renal measurements: 8.7 x 4.8 x 4.4 cm = volume: 96 mL. Echogenicity
within normal limits. No mass or hydronephrosis visualized.

Bladder:

Appears normal for degree of bladder distention.
IMPRESSION: Small kidneys.  No no hydronephrosis or renal calculi.

## 2021-04-23 ENCOUNTER — Other Ambulatory Visit: Payer: Self-pay | Admitting: Internal Medicine

## 2021-04-23 DIAGNOSIS — I10 Essential (primary) hypertension: Secondary | ICD-10-CM | POA: Diagnosis not present

## 2021-04-23 DIAGNOSIS — I714 Abdominal aortic aneurysm, without rupture, unspecified: Secondary | ICD-10-CM | POA: Diagnosis not present

## 2021-04-23 DIAGNOSIS — Z0000 Encounter for general adult medical examination without abnormal findings: Secondary | ICD-10-CM | POA: Diagnosis not present

## 2021-04-23 DIAGNOSIS — E039 Hypothyroidism, unspecified: Secondary | ICD-10-CM | POA: Diagnosis not present

## 2021-04-23 DIAGNOSIS — E7849 Other hyperlipidemia: Secondary | ICD-10-CM | POA: Diagnosis not present

## 2021-04-24 LAB — COMPLETE METABOLIC PANEL WITH GFR
AG Ratio: 1.4 (calc) (ref 1.0–2.5)
ALT: 11 U/L (ref 9–46)
AST: 21 U/L (ref 10–35)
Albumin: 4.6 g/dL (ref 3.6–5.1)
Alkaline phosphatase (APISO): 66 U/L (ref 35–144)
BUN/Creatinine Ratio: 16 (calc) (ref 6–22)
BUN: 46 mg/dL — ABNORMAL HIGH (ref 7–25)
CO2: 22 mmol/L (ref 20–32)
Calcium: 9.5 mg/dL (ref 8.6–10.3)
Chloride: 104 mmol/L (ref 98–110)
Creat: 2.83 mg/dL — ABNORMAL HIGH (ref 0.70–1.22)
Globulin: 3.3 g/dL (calc) (ref 1.9–3.7)
Glucose, Bld: 60 mg/dL — ABNORMAL LOW (ref 65–99)
Potassium: 5 mmol/L (ref 3.5–5.3)
Sodium: 141 mmol/L (ref 135–146)
Total Bilirubin: 0.5 mg/dL (ref 0.2–1.2)
Total Protein: 7.9 g/dL (ref 6.1–8.1)
eGFR: 21 mL/min/{1.73_m2} — ABNORMAL LOW (ref >=60)

## 2021-04-24 LAB — CBC
HCT: 33.1 % — ABNORMAL LOW (ref 38.5–50.0)
Hemoglobin: 10.9 g/dL — ABNORMAL LOW (ref 13.2–17.1)
MCH: 31.2 pg (ref 27.0–33.0)
MCHC: 32.9 g/dL (ref 32.0–36.0)
MCV: 94.8 fL (ref 80.0–100.0)
MPV: 10.9 fL (ref 7.5–12.5)
Platelets: 251 10*3/uL (ref 140–400)
RBC: 3.49 10*6/uL — ABNORMAL LOW (ref 4.20–5.80)
RDW: 11.8 % (ref 11.0–15.0)
WBC: 6.8 10*3/uL (ref 3.8–10.8)

## 2021-04-24 LAB — LIPID PANEL
Cholesterol: 225 mg/dL — ABNORMAL HIGH (ref <200)
HDL: 51 mg/dL (ref >=40)
LDL Cholesterol (Calc): 148 mg/dL (calc) — ABNORMAL HIGH
Non-HDL Cholesterol (Calc): 174 mg/dL (calc) — ABNORMAL HIGH (ref <130)
Total CHOL/HDL Ratio: 4.4 (calc) (ref <5.0)
Triglycerides: 135 mg/dL (ref <150)

## 2021-04-24 LAB — SPECIMEN COMPROMISED

## 2021-04-24 LAB — TSH: TSH: 3.14 mIU/L (ref 0.40–4.50)

## 2021-04-24 LAB — T4, FREE: Free T4: 1.2 ng/dL (ref 0.8–1.8)

## 2021-05-08 ENCOUNTER — Other Ambulatory Visit: Payer: Self-pay

## 2021-05-08 DIAGNOSIS — I714 Abdominal aortic aneurysm, without rupture, unspecified: Secondary | ICD-10-CM

## 2021-05-15 ENCOUNTER — Ambulatory Visit (HOSPITAL_COMMUNITY)
Admission: RE | Admit: 2021-05-15 | Discharge: 2021-05-15 | Disposition: A | Discharge status: Home / Self Care | Payer: Medicare Other | Source: Ambulatory Visit | Attending: Vascular Surgery | Admitting: Vascular Surgery

## 2021-05-15 DIAGNOSIS — I714 Abdominal aortic aneurysm, without rupture, unspecified: Secondary | ICD-10-CM | POA: Diagnosis not present

## 2021-05-20 ENCOUNTER — Ambulatory Visit (HOSPITAL_COMMUNITY)
Admission: RE | Admit: 2021-05-20 | Discharge: 2021-05-20 | Disposition: A | Discharge status: Home / Self Care | Payer: Medicare Other | Source: Ambulatory Visit | Attending: Vascular Surgery | Admitting: Vascular Surgery

## 2021-05-20 ENCOUNTER — Ambulatory Visit: Payer: Medicare Other | Admitting: Vascular Surgery

## 2021-05-20 ENCOUNTER — Encounter: Payer: Self-pay | Admitting: Vascular Surgery

## 2021-05-20 VITALS — BP 153/74 | HR 74 | Temp 98.1°F | Resp 20 | Ht 74.0 in | Wt 203.8 lb

## 2021-05-20 DIAGNOSIS — I714 Abdominal aortic aneurysm, without rupture, unspecified: Secondary | ICD-10-CM | POA: Insufficient documentation

## 2021-05-20 DIAGNOSIS — I739 Peripheral vascular disease, unspecified: Secondary | ICD-10-CM | POA: Diagnosis not present

## 2021-05-20 NOTE — Progress Notes (Signed)
? ?Patient ID: Anthony Harmon, male   DOB: August 07, 1932, 88 y.o.   MRN: [id number] ? ?Reason for Consult: Follow-up ?  ?Referred by Nolene Ebbs, MD ? ?Subjective:  ?   ?HPI: ? ?Anthony Harmon is a 88 y.o. male history of endovascular aneurysm repair for asymptomatic 5.5 cm aneurysm.  This was performed in 2019.  States that he has no new back or abdominal pain.  He does have pain particularly in his left lower extremity with walking.  He does continue to walk 3 to 4 days a week on the treadmill but can only go about 10 minutes prior to having cramping of the left calf.  At that time he stops does not attempt walk any further.  He sometimes has numbness in the middle of the night but no frank pain.  He does not have any tissue loss or ulceration.  He denies any previous history of left lower extremity intervention.  He remains on aspirin and a statin.  He is a former smoker quit prior to our endovascular aneurysm repair. ? ?Past Medical History:  ?Diagnosis Date  ? AAA (abdominal aortic aneurysm) ([place])   ? Arthritis   ? CKD (chronic kidney disease)   ? stage III 02/2017  ? GERD (gastroesophageal reflux disease)   ? High cholesterol   ? Hypertension   ? Hypothyroidism   ? Prostate cancer [facility])   ? s/p brachytherapy 08/16/07  ? ?History reviewed. No pertinent family history. ?Past Surgical History:  ?Procedure Laterality Date  ? ABDOMINAL AORTIC ENDOVASCULAR STENT GRAFT N/A 05/12/2017  ? Procedure: ABDOMINAL AORTIC ENDOVASCULAR STENT GRAFT;  Surgeon: Waynetta Sandy, MD;  Location: [place];  Service: Vascular;  Laterality: N/A;  ? None    ? ? ?Short Social History:  ?Social History  ? ?Tobacco Use  ? Smoking status: Former  ?  Packs/day: 0.25  ?  Years: 20.00  ?  Pack years: 5.00  ?  Types: Cigarettes  ?  Quit date: 04/23/2015  ?  Years since quitting: 6.0  ? Smokeless tobacco: Never  ?Substance Use Topics  ? Alcohol use: No  ? ? ?No Known Allergies ? ?Current Outpatient Medications  ?Medication Sig Dispense  Refill  ? amLODipine (NORVASC) 10 MG tablet Take 10 mg by mouth daily.    ? aspirin EC 81 MG tablet Take 81 mg by mouth daily.    ? Cholecalciferol (VITAMIN D3 PO) Take 1 capsule by mouth daily.    ? hydrALAZINE (APRESOLINE) 10 MG tablet Take 10 mg by mouth 3 (three) times daily.    ? HYDROcodone-acetaminophen (NORCO/VICODIN) 5-325 MG tablet hydrocodone 5 mg-acetaminophen 325 mg tablet ? Take 1 tablet every 6 hours by oral route.    ? ibuprofen (ADVIL,MOTRIN) 200 MG tablet Take 200 mg by mouth every 6 (six) hours as needed for headache or moderate pain.    ? levothyroxine (SYNTHROID, LEVOTHROID) 75 MCG tablet Take 75 mcg by mouth daily.     ? losartan (COZAAR) 25 MG tablet Take 25 mg by mouth daily.    ? Menthol, Topical Analgesic, (ICY HOT EX) Apply 1 application topically daily as needed (for back pain).    ? pravastatin (PRAVACHOL) 40 MG tablet Take 40 mg by mouth at bedtime.  1  ? traZODone (DESYREL) 50 MG tablet Take 50 mg by mouth at bedtime as needed for sleep.     ? ?No current facility-administered medications for this visit.  ? ? ?Review of Systems  ?Constitutional:  Constitutional negative. ?  HENT: HENT negative.  ?Eyes: Eyes negative.  ?Cardiovascular: Positive for claudication.  ?GI: Gastrointestinal negative.  ?Musculoskeletal: Musculoskeletal negative.  ?Skin: Skin negative.  ?Neurological: Neurological negative. ?Hematologic: Hematologic/lymphatic negative.  ?Psychiatric: Psychiatric negative.   ? ?   ?Objective:  ?Objective  ? ?Vitals:  ? 05/20/21 1114  ?BP: (!) 153/74  ?Pulse: 74  ?Resp: 20  ?Temp: 98.1 ?F (36.7 ?C)  ?SpO2: 98%  ?Weight: 203 lb 12.8 oz (92.4 kg)  ?Height: '6\' 2"'$  (1.88 m)  ? ?Body mass index is 26.17 kg/m?. ? ?Physical Exam ?HENT:  ?   Head: Normocephalic.  ?   Nose: Nose normal.  ?Eyes:  ?   Pupils: Pupils are equal, round, and reactive to light.  ?Cardiovascular:  ?   Rate and Rhythm: Normal rate.  ?   Pulses:     ?     Femoral pulses are 2+ on the right side and 2+ on the left  side. ?     Popliteal pulses are 2+ on the right side and 0 on the left side.  ?Abdominal:  ?   General: Abdomen is flat.  ?   Palpations: Abdomen is soft.  ?Musculoskeletal:  ?   Cervical back: Normal range of motion and neck supple.  ?Skin: ?   Capillary Refill: Capillary refill takes 2 to 3 seconds.  ?   Comments: Skin of the toes is dark discolored particularly the left greater than the right  ?Neurological:  ?   General: No focal deficit present.  ?   Mental Status: He is alert.  ?Psychiatric:     ?   Mood and Affect: Mood normal.     ?   Behavior: Behavior normal.  ? ? ?Data: ?ABI Findings:  ?+---------+------------------+-----+--------+--------+  ?Right    Rt Pressure (mmHg)IndexWaveformComment   ?+---------+------------------+-----+--------+--------+  ?Brachial 154                                      ?+---------+------------------+-----+--------+--------+  ?PTA      98                0.64 biphasic          ?+---------+------------------+-----+--------+--------+  ?DP       98                0.64 biphasic          ?+---------+------------------+-----+--------+--------+  ?Great Toe74                0.48                   ?+---------+------------------+-----+--------+--------+  ? ?+---------+------------------+-----+----------+-------+  ?Left     Lt Pressure (mmHg)IndexWaveform  Comment  ?+---------+------------------+-----+----------+-------+  ?Brachial 154                                       ?+---------+------------------+-----+----------+-------+  ?PTA      76                0.49 monophasic         ?+---------+------------------+-----+----------+-------+  ?DP       66                0.43 monophasic         ?+---------+------------------+-----+----------+-------+  ?Great [phone]  0.35                    ?+---------+------------------+-----+----------+-------+  ? ?+-------+-----------+-----------+------------+------------+   ?ABI/TBIToday's ABIToday's TBIPrevious ABIPrevious TBI  ?+-------+-----------+-----------+------------+------------+  ?Right  0.64       0.48       1.01                      ?+-------+-----------+-----------+------------+------------+  ?Left   0.49       0.35       "PAD"                     ?+-------+-----------+-----------+------------+------------+  ?   ?Summary:  ?Right: Resting right ankle-brachial index indicates moderate right lower  ?extremity arterial disease. The right toe-brachial index is abnormal.  ? ?Left: Resting left ankle-brachial index indicates severe left lower  ?extremity arterial disease. The left toe-brachial index is abnormal.  ? ?   ?Endovascular Aortic Repair (EVAR):  ?+----------+----------------+-------------------+-------------------+  ?          Diameter AP (cm)Diameter Trans (cm)Velocities (cm/sec)  ?+----------+----------------+-------------------+-------------------+  ?Aorta     3.24            3.41               74                   ?+----------+----------------+-------------------+-------------------+  ?Right Limb1.33            1.50               72                   ?+----------+----------------+-------------------+-------------------+  ?Left Limb 1.68            1.75               77                   ?+----------+----------------+-------------------+-------------------+  ? ? ? ?Summary:  ?Abdominal Aorta: Patent endovascular aneurysm repair with no evidence of  ?endoleak.  ?    ?Assessment/Plan:  ?  ?88 year old male status post endovascular aneurysm repair with aneurysm that has significantly involuted around the graft without any evidence of endoleak.  He does have ABIs that are severely decreased on the left relative to moderate on the right with relatively short distance claudication but this does not appear to be life limiting at this time.  I discussed the need to protect his feet given the dark discoloration of the skin with  particularly his decreased toe pressures on the left.  He would be difficult to treat endovascularly given previous endovascular aneurysm repair.  I discussed increasing his walking on the treadmill and continuing aspirin and statin.  He will follow-up in 1 year with repeat studies. ? ?  ? ?Waynetta Sandy MD ?[facility]

## 2021-05-21 DIAGNOSIS — H6122 Impacted cerumen, left ear: Secondary | ICD-10-CM | POA: Diagnosis not present

## 2021-05-28 DIAGNOSIS — H6523 Chronic serous otitis media, bilateral: Secondary | ICD-10-CM | POA: Diagnosis not present

## 2021-05-28 DIAGNOSIS — I714 Abdominal aortic aneurysm, without rupture, unspecified: Secondary | ICD-10-CM | POA: Diagnosis not present

## 2021-05-28 DIAGNOSIS — J302 Other seasonal allergic rhinitis: Secondary | ICD-10-CM | POA: Diagnosis not present

## 2021-05-28 DIAGNOSIS — I10 Essential (primary) hypertension: Secondary | ICD-10-CM | POA: Diagnosis not present

## 2021-06-30 ENCOUNTER — Ambulatory Visit: Payer: Medicare Other | Admitting: Podiatry

## 2021-10-27 ENCOUNTER — Other Ambulatory Visit: Payer: Self-pay | Admitting: Internal Medicine

## 2021-10-27 DIAGNOSIS — N183 Chronic kidney disease, stage 3 unspecified: Secondary | ICD-10-CM | POA: Diagnosis not present

## 2021-10-27 DIAGNOSIS — E7849 Other hyperlipidemia: Secondary | ICD-10-CM | POA: Diagnosis not present

## 2021-10-27 DIAGNOSIS — I10 Essential (primary) hypertension: Secondary | ICD-10-CM | POA: Diagnosis not present

## 2021-10-27 DIAGNOSIS — E039 Hypothyroidism, unspecified: Secondary | ICD-10-CM | POA: Diagnosis not present

## 2021-10-28 LAB — COMPLETE METABOLIC PANEL WITH GFR
AG Ratio: 1.4 (calc) (ref 1.0–2.5)
ALT: 11 U/L (ref 9–46)
AST: 22 U/L (ref 10–35)
Albumin: 4.3 g/dL (ref 3.6–5.1)
Alkaline phosphatase (APISO): 60 U/L (ref 35–144)
BUN/Creatinine Ratio: 14 (calc) (ref 6–22)
BUN: 42 mg/dL — ABNORMAL HIGH (ref 7–25)
CO2: 21 mmol/L (ref 20–32)
Calcium: 9.2 mg/dL (ref 8.6–10.3)
Chloride: 106 mmol/L (ref 98–110)
Creat: 3.07 mg/dL — ABNORMAL HIGH (ref 0.70–1.22)
Globulin: 3 g/dL (calc) (ref 1.9–3.7)
Glucose, Bld: 100 mg/dL — ABNORMAL HIGH (ref 65–99)
Potassium: 4.5 mmol/L (ref 3.5–5.3)
Sodium: 139 mmol/L (ref 135–146)
Total Bilirubin: 0.6 mg/dL (ref 0.2–1.2)
Total Protein: 7.3 g/dL (ref 6.1–8.1)
eGFR: 19 mL/min/{1.73_m2} — ABNORMAL LOW (ref >=60)

## 2021-10-28 LAB — T4, FREE: Free T4: 1 ng/dL (ref 0.8–1.8)

## 2021-10-28 LAB — LIPID PANEL
Cholesterol: 216 mg/dL — ABNORMAL HIGH (ref <200)
HDL: 47 mg/dL (ref >=40)
LDL Cholesterol (Calc): 143 mg/dL (calc) — ABNORMAL HIGH
Non-HDL Cholesterol (Calc): 169 mg/dL (calc) — ABNORMAL HIGH (ref <130)
Total CHOL/HDL Ratio: 4.6 (calc) (ref <5.0)
Triglycerides: 135 mg/dL (ref <150)

## 2021-10-28 LAB — EXTRA LAV TOP TUBE

## 2021-10-28 LAB — TSH: TSH: 3.15 mIU/L (ref 0.40–4.50)

## 2021-12-08 DIAGNOSIS — J302 Other seasonal allergic rhinitis: Secondary | ICD-10-CM | POA: Diagnosis not present

## 2021-12-08 DIAGNOSIS — I10 Essential (primary) hypertension: Secondary | ICD-10-CM | POA: Diagnosis not present

## 2021-12-08 DIAGNOSIS — N183 Chronic kidney disease, stage 3 unspecified: Secondary | ICD-10-CM | POA: Diagnosis not present

## 2022-01-14 DIAGNOSIS — N2581 Secondary hyperparathyroidism of renal origin: Secondary | ICD-10-CM | POA: Diagnosis not present

## 2022-01-14 DIAGNOSIS — N189 Chronic kidney disease, unspecified: Secondary | ICD-10-CM | POA: Diagnosis not present

## 2022-01-14 DIAGNOSIS — N184 Chronic kidney disease, stage 4 (severe): Secondary | ICD-10-CM | POA: Diagnosis not present

## 2022-01-14 DIAGNOSIS — I129 Hypertensive chronic kidney disease with stage 1 through stage 4 chronic kidney disease, or unspecified chronic kidney disease: Secondary | ICD-10-CM | POA: Diagnosis not present

## 2022-01-14 DIAGNOSIS — D631 Anemia in chronic kidney disease: Secondary | ICD-10-CM | POA: Diagnosis not present

## 2022-01-18 ENCOUNTER — Ambulatory Visit: Payer: Medicare Other | Admitting: Podiatry

## 2022-01-18 VITALS — BP 154/74 | HR 82

## 2022-01-18 DIAGNOSIS — M79676 Pain in unspecified toe(s): Secondary | ICD-10-CM

## 2022-01-18 DIAGNOSIS — I739 Peripheral vascular disease, unspecified: Secondary | ICD-10-CM | POA: Diagnosis not present

## 2022-01-18 DIAGNOSIS — L600 Ingrowing nail: Secondary | ICD-10-CM

## 2022-01-18 DIAGNOSIS — B351 Tinea unguium: Secondary | ICD-10-CM | POA: Diagnosis not present

## 2022-01-18 NOTE — Progress Notes (Signed)
  Subjective:  Patient ID: Anthony Harmon, male    DOB: November 11, 1932,  MRN: [id number]  Chief Complaint  Patient presents with   Ingrown Toenail    right great toenail ingrown    88 y.o. male presents with the above complaint. History confirmed with patient.  Its been present for some time it keeps coming back and bothering him  Objective:  Physical Exam: Warm to cool ankle to toes temperature gradient, good capillary refill, no trophic changes or ulcerative lesions, weakly palpable DP and PT pulses, normal sensory exam, and incurvated right hallux border with pain on pressure.  Assessment:   1. Ingrown toenail   2. PAD (peripheral artery disease) ([place])   3. Onychomycosis   4. Pain around toenail      Plan:  Patient was evaluated and treated and all questions answered.  Discussed treatment options for ingrowing toenail including partial permanent matricectomy, he does have some peripheral arterial disease in his lower extremities based on his ABIs from this year.  I recommended updating these and considering angiography prior to proceeding with a partial prior matricectomy he says he does not want to go through with all this and would rather just have the nail trimmed back.  We did this today and the nails debrided and slant back fashion of length and thickness with a sharp nail nipper.  He will return for routine visits in the future for this.  Return in about 3 months (around 04/19/2022) for painful nails.

## 2022-01-26 DIAGNOSIS — I10 Essential (primary) hypertension: Secondary | ICD-10-CM | POA: Diagnosis not present

## 2022-01-26 DIAGNOSIS — N183 Chronic kidney disease, stage 3 unspecified: Secondary | ICD-10-CM | POA: Diagnosis not present

## 2022-01-26 DIAGNOSIS — E039 Hypothyroidism, unspecified: Secondary | ICD-10-CM | POA: Diagnosis not present

## 2022-01-26 DIAGNOSIS — E7849 Other hyperlipidemia: Secondary | ICD-10-CM | POA: Diagnosis not present

## 2022-03-04 DIAGNOSIS — H04123 Dry eye syndrome of bilateral lacrimal glands: Secondary | ICD-10-CM | POA: Diagnosis not present

## 2022-03-04 DIAGNOSIS — H40033 Anatomical narrow angle, bilateral: Secondary | ICD-10-CM | POA: Diagnosis not present

## 2022-04-19 ENCOUNTER — Ambulatory Visit: Payer: Medicare Other | Admitting: Podiatry

## 2022-04-27 DIAGNOSIS — E7849 Other hyperlipidemia: Secondary | ICD-10-CM | POA: Diagnosis not present

## 2022-04-27 DIAGNOSIS — I10 Essential (primary) hypertension: Secondary | ICD-10-CM | POA: Diagnosis not present

## 2022-04-27 DIAGNOSIS — E038 Other specified hypothyroidism: Secondary | ICD-10-CM | POA: Diagnosis not present

## 2022-04-27 DIAGNOSIS — Z131 Encounter for screening for diabetes mellitus: Secondary | ICD-10-CM | POA: Diagnosis not present

## 2022-04-27 DIAGNOSIS — Z0000 Encounter for general adult medical examination without abnormal findings: Secondary | ICD-10-CM | POA: Diagnosis not present

## 2022-08-24 DIAGNOSIS — I714 Abdominal aortic aneurysm, without rupture, unspecified: Secondary | ICD-10-CM | POA: Diagnosis not present

## 2022-08-24 DIAGNOSIS — E038 Other specified hypothyroidism: Secondary | ICD-10-CM | POA: Diagnosis not present

## 2022-08-24 DIAGNOSIS — E7849 Other hyperlipidemia: Secondary | ICD-10-CM | POA: Diagnosis not present

## 2022-08-24 DIAGNOSIS — I10 Essential (primary) hypertension: Secondary | ICD-10-CM | POA: Diagnosis not present

## 2022-09-03 ENCOUNTER — Ambulatory Visit (INDEPENDENT_AMBULATORY_CARE_PROVIDER_SITE_OTHER): Payer: Medicare Other | Admitting: Podiatry

## 2022-09-03 DIAGNOSIS — B351 Tinea unguium: Secondary | ICD-10-CM | POA: Diagnosis not present

## 2022-09-03 DIAGNOSIS — M79674 Pain in right toe(s): Secondary | ICD-10-CM

## 2022-09-03 DIAGNOSIS — M79675 Pain in left toe(s): Secondary | ICD-10-CM | POA: Diagnosis not present

## 2022-09-03 NOTE — Progress Notes (Signed)
  Subjective:  Patient ID: Anthony Harmon, male    DOB: [birth year],  MRN: [id number]  Chief Complaint  Patient presents with   Nail Problem    Bilateral hallux nail discomfort    89 y.o. male returns for the above complaint.  Patient presents with thickened elongated dystrophic mycotic toenails x 10 mild pain on palpation arch with ambulation is with pressure would like for me to debride out is noted will do it himself he denies any other acute complaints.  Objective:  There were no vitals filed for this visit. Podiatric Exam: Vascular: dorsalis pedis and posterior tibial pulses are palpable bilateral. Capillary return is immediate. Temperature gradient is WNL. Skin turgor WNL  Sensorium: Normal Semmes Weinstein monofilament test. Normal tactile sensation bilaterally. Nail Exam: Pt has thick disfigured discolored nails with subungual debris noted bilateral entire nail hallux through fifth toenails.  Pain on palpation to the nails. Ulcer Exam: There is no evidence of ulcer or pre-ulcerative changes or infection. Orthopedic Exam: Muscle tone and strength are WNL. No limitations in general ROM. No crepitus or effusions noted.  Skin: No Porokeratosis. No infection or ulcers    Assessment & Plan:   1. Pain due to onychomycosis of toenails of both feet     Patient was evaluated and treated and all questions answered.  Onychomycosis with pain  -Nails palliatively debrided as below. -Educated on self-care  Procedure: Nail Debridement Rationale: pain  Type of Debridement: manual, sharp debridement. Instrumentation: Nail nipper, rotary burr. Number of Nails: 10  Procedures and Treatment: Consent by patient was obtained for treatment procedures. The patient understood the discussion of treatment and procedures well. All questions were answered thoroughly reviewed. Debridement of mycotic and hypertrophic toenails, 1 through 5 bilateral and clearing of subungual debris. No ulceration, no  infection noted.  Return Visit-Office Procedure: Patient instructed to return to the office for a follow up visit 3 months for continued evaluation and treatment.  Nicholes Rough, DPM    No follow-ups on file.

## 2022-12-28 DIAGNOSIS — N183 Chronic kidney disease, stage 3 unspecified: Secondary | ICD-10-CM | POA: Diagnosis not present

## 2022-12-28 DIAGNOSIS — I10 Essential (primary) hypertension: Secondary | ICD-10-CM | POA: Diagnosis not present

## 2022-12-28 DIAGNOSIS — E039 Hypothyroidism, unspecified: Secondary | ICD-10-CM | POA: Diagnosis not present

## 2022-12-28 DIAGNOSIS — J302 Other seasonal allergic rhinitis: Secondary | ICD-10-CM | POA: Diagnosis not present

## 2023-03-09 ENCOUNTER — Ambulatory Visit: Payer: Medicare Other | Admitting: Podiatry

## 2023-04-27 ENCOUNTER — Ambulatory Visit: Admitting: Podiatry

## 2023-04-27 ENCOUNTER — Encounter: Payer: Self-pay | Admitting: Podiatry

## 2023-04-27 DIAGNOSIS — L600 Ingrowing nail: Secondary | ICD-10-CM

## 2023-04-27 NOTE — Progress Notes (Signed)
 Subjective:   Patient ID: Anthony Harmon, male   DOB: 89 y.o.   MRN: [id number]   HPI Patient presents stating this ingrown toenail has really started to bother me again on the right foot   ROS      Objective:  Physical Exam  Vascular status significantly diminished but patient has not pursued treatment as there is no other pathology and he does not have claudication symptoms.  Patient has incurvated medial border right hallux with good digital perfusion noted no erythema drainage noted     Assessment:  Chronic ingrown toenail right may also have some pathology associated with vascular disease     Plan:  H&P reviewed I do not want to do anything too aggressive but I want to try to get him better so I anesthetized 60 mg like Marcaine mixture did do sterile prep and using sterile instrumentation did a slant back removal of the right hallux medial border it did bleed well I flushed but I did not apply any chemicals as I was concerned about healing.  Applied sterile dressing instructed on soaks reappoint as symptoms indicate.  Patient does understand that ultimately if healing does not occur he eventually may need amputation and possible vascular procedure

## 2023-06-09 DIAGNOSIS — N183 Chronic kidney disease, stage 3 unspecified: Secondary | ICD-10-CM | POA: Diagnosis not present

## 2023-06-09 DIAGNOSIS — E7849 Other hyperlipidemia: Secondary | ICD-10-CM | POA: Diagnosis not present

## 2023-06-09 DIAGNOSIS — E039 Hypothyroidism, unspecified: Secondary | ICD-10-CM | POA: Diagnosis not present

## 2023-06-09 DIAGNOSIS — I10 Essential (primary) hypertension: Secondary | ICD-10-CM | POA: Diagnosis not present

## 2023-07-28 ENCOUNTER — Other Ambulatory Visit: Payer: Self-pay | Admitting: Physician Assistant

## 2023-07-28 DIAGNOSIS — H93A2 Pulsatile tinnitus, left ear: Secondary | ICD-10-CM

## 2023-08-02 ENCOUNTER — Ambulatory Visit
Admission: RE | Admit: 2023-08-02 | Discharge: 2023-08-02 | Disposition: A | Discharge status: Home / Self Care | Source: Ambulatory Visit | Attending: Physician Assistant | Admitting: Physician Assistant

## 2023-08-02 DIAGNOSIS — H93A2 Pulsatile tinnitus, left ear: Secondary | ICD-10-CM

## 2023-11-02 ENCOUNTER — Ambulatory Visit (INDEPENDENT_AMBULATORY_CARE_PROVIDER_SITE_OTHER): Admitting: Podiatry

## 2023-11-02 ENCOUNTER — Ambulatory Visit: Admitting: Podiatry

## 2023-11-02 ENCOUNTER — Encounter: Payer: Self-pay | Admitting: Podiatry

## 2023-11-02 DIAGNOSIS — L600 Ingrowing nail: Secondary | ICD-10-CM

## 2023-11-02 NOTE — Progress Notes (Signed)
 Patient complains of painful ingrown medial border(s) hallux right. Patient denies fevers, chills, nausea, vomiting.  Objective:  Vitals: Reviewed  General: Well developed, nourished, in no acute distress, alert and oriented x3   Vascular: DP pulse 1/4 bilateral. PT pulse 2/4 bilateral.  Mild edema hallux right  Dermatology: Erythema, edema, incurvated nail border medial hallux right with slight clear drainage . Tenderness present with palpation. Normal skin tone and texture feet with normal hair growth.  Neurological: Grossly intact. Normal reflexes.   Musculoskeletal: Tenderness with palpation of the distal hallux right medially. No tenderness or painful ROM at IPJ.  Diagnosis: Ingrown nail medial border hallux right  Plan: -discussed etiology and treatment of ingrown nails. Discussed surgical vs conservative treatment. -Consent signed for appropriate matrixectomy affected nail(s).   Procedure(s):   - Matrixectomy(s) medial border hallux right: Toe anesthetized with 3cc 2:1 mixture 2% Lidocaine  with epinephrine : Sodium Bicarbonate. Surgical site prepped. Digital tourniquet applied.  Avulsion of nail border. performed. Matrixecomy performed with three 30 second applications of phenol to nail matrix. Site irrigated with alcohol.  Tourniquet released with good vascularity noticed in digit.  Applied triple antibiotic to nailbed and applied gauze and Coban dressing. - Written and oral postoperative instructions given.  -Return for post-op 2 weeks.  JINNY Prentice Binder, DPM

## 2023-11-07 ENCOUNTER — Other Ambulatory Visit: Payer: Self-pay | Admitting: Podiatry

## 2023-11-07 ENCOUNTER — Telehealth: Payer: Self-pay | Admitting: Podiatry

## 2023-11-07 MED ORDER — NEOMYCIN-POLYMYXIN-HC 3.5-10000-1 OT SOLN: OTIC | 0 refills | Status: AC

## 2023-11-07 NOTE — Telephone Encounter (Signed)
 Pt called in stating that he had an ingrown toenail removed  (9/17)he is in so much pain he was not able to get any sleep and his toe is throbbing now and today is his birthday and he don't want to spend a birthday like this please advise pt wants to know what can be done  [phone])

## 2023-11-08 NOTE — Telephone Encounter (Signed)
 Spoke to patient advised.

## 2023-11-11 ENCOUNTER — Ambulatory Visit: Admitting: Podiatry

## 2023-11-18 ENCOUNTER — Ambulatory Visit: Admitting: Podiatry

## 2023-11-23 ENCOUNTER — Encounter: Payer: Self-pay | Admitting: Podiatry

## 2023-11-23 ENCOUNTER — Ambulatory Visit: Admitting: Podiatry

## 2023-11-23 DIAGNOSIS — L97511 Non-pressure chronic ulcer of other part of right foot limited to breakdown of skin: Secondary | ICD-10-CM | POA: Diagnosis not present

## 2023-11-23 MED ORDER — DOXYCYCLINE HYCLATE 100 MG PO TABS: 100.0000 mg | ORAL_TABLET | Freq: Two times a day (BID) | ORAL | 1 refills | Status: DC

## 2023-11-23 NOTE — Progress Notes (Signed)
 Subjective:   Patient ID: Anthony Harmon, male   DOB: 89 y.o.   MRN: [id number]   HPI Patient presents approximately 3 weeks ago he had an ingrown toenail removed by Dr. Gabriel and the skin is been irritated and bothersome for him and wants it checked   ROS      Objective:  Physical Exam  Vascular status moderately diminished neurological intact digital perfusion adequate patient's actual hallux nail bed that was matrix performed is doing well proximal within the skin there are several areas of irritation and superficial breakdown of tissue localized no proximal edema erythema or drainage noted     Assessment:  Does not appear to be where the actual ingrown was done but more proximal with low-grade ulceration     Plan:  Possible that he just had dressing on too tight and it caused some ischemic changes or that it may have been due to chemical burn or possible over soaking.  At this point I applied Iodosorb to try to dry the area out it measures about 8 x 8 mm but it is very superficial with no indication of proximal irritation.  I then went ahead as precautionary measure placed him on doxycycline and he will stop soaks and strict instructions of any worsening condition or any pathology he will reappoint immediately.  I am hopeful this will heal uneventfully but may take several more weeks and encouraged to call with questions or concerns that may occur

## 2023-12-07 ENCOUNTER — Ambulatory Visit (INDEPENDENT_AMBULATORY_CARE_PROVIDER_SITE_OTHER)

## 2023-12-07 ENCOUNTER — Encounter: Payer: Self-pay | Admitting: Podiatry

## 2023-12-07 ENCOUNTER — Ambulatory Visit (INDEPENDENT_AMBULATORY_CARE_PROVIDER_SITE_OTHER): Admitting: Podiatry

## 2023-12-07 DIAGNOSIS — L97511 Non-pressure chronic ulcer of other part of right foot limited to breakdown of skin: Secondary | ICD-10-CM | POA: Diagnosis not present

## 2023-12-07 MED ORDER — NITROGLYCERIN 0.2 MG/HR TD PT24: 0.2000 mg | MEDICATED_PATCH | Freq: Every day | TRANSDERMAL | 12 refills | Status: AC

## 2023-12-07 NOTE — Progress Notes (Signed)
 Subjective:   Patient ID: Anthony Harmon, male   DOB: 89 y.o.   MRN: [id number]   HPI Patient states he is not getting a lot of drainage but it still open and he is frustrated with his right big toe   ROS      Objective:  Physical Exam  Vascular status is compromised with left actually worse than right but I am concerned has been 2 years since he was tested and he does have several areas on the right dorsal hallux of necrotic tissue but it does not appear to be worsening and is hopefully drying out     Assessment:  Several ulcerations of the right hallux superficial measuring around 7 x 7 mm with no subcutaneous bone or tendon exposure with vascular disease history of ingrown toenail removal which has healed well     Plan:  H&P reviewed I am sending him for vascular studies at this time and I am having him do Betadine wet-to-dry dressings and working to try a Nitropatch to increase his circulation to the right big toe.  Patient will be seen back in 3 weeks earlier if any issues were to occur that is not normal

## 2023-12-19 ENCOUNTER — Encounter: Payer: Self-pay | Admitting: Podiatry

## 2023-12-19 ENCOUNTER — Ambulatory Visit: Admitting: Podiatry

## 2023-12-19 DIAGNOSIS — I999 Unspecified disorder of circulatory system: Secondary | ICD-10-CM | POA: Diagnosis not present

## 2023-12-19 DIAGNOSIS — L97511 Non-pressure chronic ulcer of other part of right foot limited to breakdown of skin: Secondary | ICD-10-CM

## 2023-12-19 MED ORDER — HYDROCODONE-ACETAMINOPHEN 10-325 MG PO TABS
1.0000 | ORAL_TABLET | Freq: Three times a day (TID) | ORAL | 0 refills | Status: AC | PRN
Start: 2023-12-19 — End: 2023-12-24

## 2023-12-19 MED ORDER — SANTYL 250 UNIT/GM EX OINT: 1.0000 | TOPICAL_OINTMENT | Freq: Every day | CUTANEOUS | 0 refills | Status: DC

## 2023-12-21 ENCOUNTER — Telehealth: Payer: Self-pay | Admitting: Lab

## 2023-12-21 NOTE — Progress Notes (Signed)
 Subjective:   Patient ID: Anthony Harmon, male   DOB: 89 y.o.   MRN: [id number]   HPI Patient presents stating that the hallux right is still bothering him and while he has not noted any drainage it still can be irritative   ROS      Objective:  Physical Exam  Neurovascular status unchanged I have ordered vascular study and they unfortunately must be running behind so I am putting stat on this to get it done but there does appear to be warmth to the toe no further necrosis that is occurring no darkening of the tissue with local breakdown of tissue proximal to the nail that was removed 6 weeks ago     Assessment:  He told well from having had ingrown toenail surgery but has developed some breakdown of tissue dorsally that remains aggravated     Plan:  H&P reviewed and I did have Dr. Alona also see the patient and were in agreement that over time this should heal.  I am going to write him for pain medicine at night and I did also write for Santyl to try to debride tissue present that he will use daily and I did give instructions that if any changes were to occur I want to see him right away but we are optimistic that eventually this will heal uneventfully even though the possibility of amputation still does exist.  We will get the results of his vascular studies but I do not at this time feel that there will be anything they will be able to do to increase the status of circulatory flow

## 2023-12-21 NOTE — Telephone Encounter (Signed)
 Patient states ointment that was called in is over 300.00 dollars and would like a cheaper option please review and advise.

## 2023-12-22 ENCOUNTER — Ambulatory Visit (HOSPITAL_COMMUNITY)
Admission: RE | Admit: 2023-12-22 | Discharge: 2023-12-22 | Disposition: A | Discharge status: Home / Self Care | Source: Ambulatory Visit | Attending: Podiatry | Admitting: Podiatry

## 2023-12-22 DIAGNOSIS — L97511 Non-pressure chronic ulcer of other part of right foot limited to breakdown of skin: Secondary | ICD-10-CM | POA: Insufficient documentation

## 2023-12-22 LAB — VAS US ABI WITH/WO TBI
Left ABI: 0.69
Right ABI: 0.84

## 2023-12-25 NOTE — Telephone Encounter (Signed)
 Silvadene ointment

## 2023-12-28 ENCOUNTER — Ambulatory Visit: Admitting: Podiatry

## 2023-12-28 ENCOUNTER — Encounter: Payer: Self-pay | Admitting: Podiatry

## 2023-12-28 DIAGNOSIS — L97511 Non-pressure chronic ulcer of other part of right foot limited to breakdown of skin: Secondary | ICD-10-CM

## 2023-12-28 DIAGNOSIS — I739 Peripheral vascular disease, unspecified: Secondary | ICD-10-CM

## 2023-12-28 MED ORDER — SANTYL 250 UNIT/GM EX OINT: 1.0000 | TOPICAL_OINTMENT | Freq: Every day | CUTANEOUS | 0 refills | Status: AC

## 2023-12-28 NOTE — Progress Notes (Signed)
 Subjective:   Patient ID: Anthony Harmon, male   DOB: 89 y.o.   MRN: [id number]   HPI Patient presents concerned because still has breakdown of skin dorsal right hallux that  not as tender as it was he could still not wear shoe gear.  Patient is still very concerned about healing   ROS      Objective:  Physical Exam  Neurovascular unchanged with diminished circulatory status but appears to be adequate for healing with a necrotic area of tissue dorsum right hallux at the inner phalangeal joint measuring approximately 2.8 cm x 1.8 cm.  There is no subcutaneous or active drainage exposure noted     Assessment:  Patient continues to experience breakdown of tissue that is not healed dorsal right and would benefit greatly from Santyl     Plan:  H&P reviewed this with him he is going to get a refill of his nitro patches and will use those starting Friday continue soaks and we are continuing to work to get him approved for Santyl as I think will be important to try to get this condition to heal completely

## 2024-01-02 ENCOUNTER — Telehealth: Payer: Self-pay | Admitting: Lab

## 2024-01-02 NOTE — Telephone Encounter (Signed)
 Patient calling foot not healing last ointment called in was expensive patient states provider was suppose to call in something he can afford please review and advise.

## 2024-01-04 ENCOUNTER — Telehealth: Payer: Self-pay

## 2024-01-04 NOTE — Telephone Encounter (Signed)
 This patient was supposed to receive santyl  from the specialty pharmacy

## 2024-01-04 NOTE — Telephone Encounter (Signed)
 Patient called back regarding ointment that was supposed to be called in in place of the first prescription that was too expensive. Patient states that his toe is not healing and is painful.

## 2024-01-05 NOTE — Telephone Encounter (Signed)
 The pharmacy should reach out to him He can call [phone]. Her name is Eleanor Rummer and she can let him know status

## 2024-01-09 ENCOUNTER — Other Ambulatory Visit: Payer: Self-pay | Admitting: Podiatry

## 2024-01-09 MED ORDER — HYDROCODONE-ACETAMINOPHEN 10-325 MG PO TABS: 1.0000 | ORAL_TABLET | Freq: Three times a day (TID) | ORAL | 0 refills | Status: AC | PRN

## 2024-01-09 NOTE — Telephone Encounter (Signed)
 Patient called and left another message concerning the pain in his toe and the toe not healing. Patient is requesting medication for pain. He stated that the pain id running up his leg.  I spoke with Geni, with [facility] and she stated that the patient part d copay for Santyl  is $259.63. Due to patient's part D he is not eligible for copay assistance. They did reach out to the patient on 01/02/24 and discussed the cost with the patient and he declined the prescription due to cost.

## 2024-01-09 NOTE — Telephone Encounter (Signed)
 He can come in next week if still having problems. I sent him pain medicine to take as needed

## 2024-01-14 ENCOUNTER — Other Ambulatory Visit: Payer: Self-pay

## 2024-01-14 ENCOUNTER — Emergency Department (HOSPITAL_COMMUNITY)

## 2024-01-14 ENCOUNTER — Encounter (HOSPITAL_COMMUNITY): Payer: Self-pay

## 2024-01-14 ENCOUNTER — Emergency Department (HOSPITAL_COMMUNITY)
Admission: EM | Admit: 2024-01-14 | Discharge: 2024-01-14 | Disposition: A | Discharge status: Home / Self Care | Attending: Emergency Medicine | Admitting: Emergency Medicine

## 2024-01-14 DIAGNOSIS — Z7982 Long term (current) use of aspirin: Secondary | ICD-10-CM | POA: Diagnosis not present

## 2024-01-14 DIAGNOSIS — L97519 Non-pressure chronic ulcer of other part of right foot with unspecified severity: Secondary | ICD-10-CM | POA: Insufficient documentation

## 2024-01-14 DIAGNOSIS — E875 Hyperkalemia: Secondary | ICD-10-CM | POA: Insufficient documentation

## 2024-01-14 LAB — BASIC METABOLIC PANEL WITH GFR
Anion gap: 10 (ref 5–15)
BUN: 54 mg/dL — ABNORMAL HIGH (ref 8–23)
CO2: 22 mmol/L (ref 22–32)
Calcium: 9.5 mg/dL (ref 8.9–10.3)
Chloride: 107 mmol/L (ref 98–111)
Creatinine, Ser: 3.29 mg/dL — ABNORMAL HIGH (ref 0.61–1.24)
GFR, Estimated: 17 mL/min — ABNORMAL LOW (ref >60)
Glucose, Bld: 89 mg/dL (ref 70–99)
Potassium: 5.4 mmol/L — ABNORMAL HIGH (ref 3.5–5.1)
Sodium: 139 mmol/L (ref 135–145)

## 2024-01-14 LAB — CBC WITH DIFFERENTIAL/PLATELET
Abs Immature Granulocytes: 0.02 K/uL (ref 0.00–0.07)
Basophils Absolute: 0 K/uL (ref 0.0–0.1)
Basophils Relative: 1 %
Eosinophils Absolute: 0.5 K/uL (ref 0.0–0.5)
Eosinophils Relative: 7 %
HCT: 32.1 % — ABNORMAL LOW (ref 39.0–52.0)
Hemoglobin: 10.1 g/dL — ABNORMAL LOW (ref 13.0–17.0)
Immature Granulocytes: 0 %
Lymphocytes Relative: 27 %
Lymphs Abs: 1.8 K/uL (ref 0.7–4.0)
MCH: 31.4 pg (ref 26.0–34.0)
MCHC: 31.5 g/dL (ref 30.0–36.0)
MCV: 99.7 fL (ref 80.0–100.0)
Monocytes Absolute: 0.7 K/uL (ref 0.1–1.0)
Monocytes Relative: 11 %
Neutro Abs: 3.4 K/uL (ref 1.7–7.7)
Neutrophils Relative %: 54 %
Platelets: 198 K/uL (ref 150–400)
RBC: 3.22 MIL/uL — ABNORMAL LOW (ref 4.22–5.81)
RDW: 12.2 % (ref 11.5–15.5)
WBC: 6.4 K/uL (ref 4.0–10.5)
nRBC: 0 % (ref 0.0–0.2)

## 2024-01-14 MED ORDER — SODIUM ZIRCONIUM CYCLOSILICATE 5 G PO PACK
5.0000 g | PACK | Freq: Once | ORAL | Status: AC
  Administered 2024-01-14: 5 g via ORAL
  Filled 2024-01-14: qty 1

## 2024-01-14 NOTE — ED Notes (Signed)
 Patient alert and oriented x 4. Airway patent, respirations even and unlabored. Skin normal, warm and dry. Discharge instructions discussed with patient and patient's family. Patient has no questions at this time. Patient has safe ride home.

## 2024-01-14 NOTE — ED Provider Notes (Signed)
 [place] EMERGENCY DEPARTMENT AT [facility] Provider Note   CSN: [id number] Arrival date & time: 01/14/24  9063     Patient presents with: Nail Problem   Anthony Harmon is a 89 y.o. male who presents with a right great toe ulcer.  Patient states that he developed the ulcer shortly after having an ingrown toenail removed on the same affected toe.  Patient states that he has been doing epsom salt soaks and keeping the area clean, however feels like the ulcer is not healing.  Patient states that he is not a diabetic and has not experienced ulcers like this in the past.  Patient denies fevers, purulent drainage, swelling, redness, or warmth.  Patient denies having any difficulty ambulating on the affected foot.  Patient states that he is being followed by podiatry for the ulcer and has been prescribed topical treatment that he has not picked up from the pharmacy.  {Add pertinent medical, surgical, social history, OB history to HPI:32947} HPI     Prior to Admission medications   Medication Sig Start Date End Date Taking? Authorizing Provider  amLODipine  (NORVASC ) 10 MG tablet Take 10 mg by mouth daily. 03/27/19   [provider]  aspirin  EC 81 MG tablet Take 81 mg by mouth daily.    [provider]  Cholecalciferol (VITAMIN D3 PO) Take 1 capsule by mouth daily.    [provider]  collagenase  (SANTYL ) 250 UNIT/GM ointment Apply 1 Application topically daily. 12/28/23   Magdalen Pasco RAMAN, DPM  hydrALAZINE  (APRESOLINE ) 10 MG tablet Take 10 mg by mouth 3 (three) times daily. 08/08/19   [provider]  HYDROcodone -acetaminophen  (NORCO) 10-325 MG tablet Take 1 tablet by mouth every 8 (eight) hours as needed for up to 5 days. 01/09/24 01/14/24  Magdalen Pasco RAMAN, DPM  HYDROcodone -acetaminophen  (NORCO/VICODIN) 5-325 MG tablet hydrocodone  5 mg-acetaminophen  325 mg tablet  Take 1 tablet every 6 hours by oral route.    [provider]  ibuprofen   (ADVIL ,MOTRIN ) 200 MG tablet Take 200 mg by mouth every 6 (six) hours as needed for headache or moderate pain.    [provider]  levothyroxine  (SYNTHROID , LEVOTHROID) 75 MCG tablet Take 75 mcg by mouth daily.  01/25/17   [provider]  losartan (COZAAR) 25 MG tablet Take 25 mg by mouth daily. 08/07/19   [provider]  Menthol, Topical Analgesic, (ICY HOT EX) Apply 1 application topically daily as needed (for back pain).    [provider]  neomycin -polymyxin-hydrocortisone (CORTISPORIN) OTIC solution 1-2 drops to nail bed BID.Cover with light bandage. 11/07/23   Christine Rush, DPM  nitroGLYCERIN  (NITRO-DUR ) 0.2 mg/hr patch Place 1 patch (0.2 mg total) onto the skin daily. 12/07/23   Magdalen Pasco RAMAN, DPM  pravastatin  (PRAVACHOL ) 40 MG tablet Take 40 mg by mouth at bedtime. 10/05/17   [provider]  traZODone  (DESYREL ) 50 MG tablet Take 50 mg by mouth at bedtime as needed for sleep.  04/26/13   [provider]    Allergies: Patient has no known allergies.    Review of Systems  Skin:  Positive for wound.    Updated Vital Signs BP (!) 189/93   Pulse (!) 57   Temp 98 F (36.7 C) (Oral)   Resp 15   Ht 6' 2 (1.88 m)   Wt 85.7 kg   SpO2 100%   BMI 24.27 kg/m   Physical Exam Vitals and nursing note reviewed.  Constitutional:      General:  He is not in acute distress.    Appearance: Normal appearance.  HENT:     Head: Normocephalic and atraumatic.  Eyes:     Extraocular Movements: Extraocular movements intact.     Conjunctiva/sclera: Conjunctivae normal.     Pupils: Pupils are equal, round, and reactive to light.  Cardiovascular:     Rate and Rhythm: Normal rate and regular rhythm.     Pulses: Normal pulses.  Pulmonary:     Effort: Pulmonary effort is normal. No respiratory distress.  Abdominal:     General: Abdomen is flat.     Palpations: Abdomen is soft.     Tenderness: There is no abdominal tenderness.   Musculoskeletal:        General: Normal range of motion.     Cervical back: Normal range of motion.  Feet:     Comments: Ulcer present on the dorsum of the right hallux as pictured below.  There is no diffuse swelling to the right foot, erythema, or warmth.  The ulcer appears to be in a healing state.  Patient denies any difficulty walking on the affected foot. Skin:    General: Skin is warm and dry.     Capillary Refill: Capillary refill takes less than 2 seconds.  Neurological:     General: No focal deficit present.     Mental Status: He is alert. Mental status is at baseline.  Psychiatric:        Mood and Affect: Mood normal.     (all labs ordered are listed, but only abnormal results are displayed) Labs Reviewed  CBC WITH DIFFERENTIAL/PLATELET - Abnormal; Notable for the following components:      Result Value   RBC 3.22 (*)    Hemoglobin 10.1 (*)    HCT 32.1 (*)    All other components within normal limits  BASIC METABOLIC PANEL WITH GFR - Abnormal; Notable for the following components:   Potassium 5.4 (*)    BUN 54 (*)    Creatinine, Ser 3.29 (*)    GFR, Estimated 17 (*)    All other components within normal limits    EKG: None  Radiology: DG Toe Great Right Result Date: 01/14/2024 EXAM: 3 VIEW(S) XRAY OF THE TOES 01/14/2024 10:24:00 AM COMPARISON: None available. CLINICAL HISTORY: Toe problem FINDINGS: BONES AND JOINTS: No acute fracture. No focal osseous lesion. No joint dislocation. SOFT TISSUES: Dorsal soft tissue swelling. Superficial ulceration along the medial aspect of the great toe at the level of the interphalangeal joint. IMPRESSION: 1. Dorsal soft tissue swelling and superficial ulceration along the medial aspect of the great toe. No osseous abnormality. Electronically signed by: Norleen Kil MD 01/14/2024 11:01 AM EST RP Workstation: [id number]    {Document cardiac monitor, telemetry assessment procedure when appropriate:32947} Procedures   Medications  Ordered in the ED  sodium zirconium cyclosilicate (LOKELMA) packet 5 g (5 g Oral Given 01/14/24 1216)    Clinical Course as of 01/14/24 1345  Sat Jan 14, 2024  1021 Ulcer on R great toe x 1 month  [ML]  1023 Temp: 97.8 F (36.6 C) Patient afebrile, vitals stable. Denies subjective fevers at home.  [ML]  1121 DG Toe Great Right no signs of osteomyelitis [ML]  1123 Basic metabolic panel(!) Creatinine chronically elevated due to CKD.  Hyperkalemia at 5.4. [ML]  1126 CBC with Differential(!) No acute findings [ML]  1238 Patient given small dose of Lokelma for hyperkalemia-well-tolerated. Patient in no acute distress on reassessment.  [ML]  1255 ED  EKG Completed due to hyperkalemia.  No signs of peaked T waves/sine waves. Normal Sinus rhythm. [ML]  1320 Postop shoe applied for comfort. [ML]    Clinical Course User Index [ML] Willma Duwaine CROME, PA       {Click here for ABCD2, HEART and other calculators REFRESH Note before signing:1}                              Medical Decision Making Amount and/or Complexity of Data Reviewed Labs: ordered. Decision-making details documented in ED Course. Radiology: ordered. Decision-making details documented in ED Course. ECG/medicine tests:  Decision-making details documented in ED Course.  Risk Prescription drug management.   Patient presents to the ED for: ulcer on right great toe x 1 month This involves an extensive number of treatment options Differential diagnosis includes: PAD, osteomyelitis, abscess, cellulitis. Co-morbid conditions: Arthritis, hypertension. Additional history/records obtained and reviewed: Additional history obtained from  outside medical records External records from outside source obtained and reviewed including multiple podiatry visits for treatment of same.  The last visit was 12/28/2023. Clinical Course as of 01/14/24 1345  Sat Jan 14, 2024  1021 Ulcer on R great toe x 1 month  [ML]  1023 Temp: 97.8 F (36.6  C) Patient afebrile, vitals stable. Denies subjective fevers at home.  [ML]  1121 DG Toe Great Right no signs of osteomyelitis [ML]  1123 Basic metabolic panel(!) Creatinine chronically elevated due to CKD.  Hyperkalemia at 5.4. [ML]  1126 CBC with Differential(!) No acute findings [ML]  1238 Patient given small dose of Lokelma for hyperkalemia-well-tolerated. Patient in no acute distress on reassessment.  [ML]  1255 ED EKG Completed due to hyperkalemia.  No signs of peaked T waves/sine waves. Normal Sinus rhythm. [ML]  1320 Postop shoe applied for comfort. [ML]    Clinical Course User Index [ML] Willma Duwaine CROME, [place]    Data Reviewed / Actions Taken: Labs ordered/reviewed with my independent interpretation in ED course above. Imaging ordered/reviewed with my independent interpretation in ED course above. I agree with the radiologists interpretation.  EKG ordered/reviewed with my independent interpretation in ED course above. The patient did not require continuous cardiac monitoring during the ED stay. Key findings for the patient were reviewed with the attending physician, and ongoing clinical collaboration was maintained throughout the visit  Management / Treatments: Upon discussion with Dr. Zammit on new onset mild hyperkalemia, small dose of low kalemia to be given. See ED course above for medications, treatments administered, and clinical rationale. Reevaluation of the patient after these medicines showed that the patient improved. I have reviewed the patients home medicines and have made adjustments as needed  Test Considered/Diagnostic tools:  Additional diagnostic testing was no considered based on the patient's presenting symptoms, risk factors, and initial clinical assessment.   ED Course / Reassessments: Problem List:  89 year old male presented for a right great toe ulcer x 1 month, and the patient was thoroughly evaluated. Initial assessment included history, physical exam,  and review of prior medical records. Vital signs were obtained and monitored, and the patient remained stable throughout the stay. General physical exam included focused assessment of the ulcer on his right great toe and revealed no signs of ongoing infection. Laboratory studies, imaging, and other ancillary studies were obtained as clinically indicated, and key results included elevated creatinine from his baseline and mild hyperkalemia.  Patient states that he has been noncompliant with treatment from his nephrologist,  and needs to follow-up with them for further.  Patient was given a small dose of Lokelma for hyperkalemia upon discussion with Dr. Zammit.  Clinical decision tools were used where appropriate to support diagnostic accuracy and risk stratification. Pain and symptoms were addressed during the visit.  The patient was discharged in stable condition.  Patient was placed in a hospital shoe for comfort, and is to pick up specialty cream ordered by podiatry for further treatment of wound. Patient response: Stayed the same Serial reassessments performed: {Yes}    Social determinants impacting care: None  Disposition: Disposition: Discharge with close follow-up with nephrologist given elevated creatinine and hyperkalemia abnormal, and instructions on how to pick up his prescription cream The disposition plan and rationale were discussed with the patient at the bedside, all questions were addressed, and the patient demonstrated understanding.  This note was produced using Electronics Engineer. While the provider has reviewed and verified all clinical information, transcription errors may remain.   {Document critical care time when appropriate  Document review of labs and clinical decision tools ie [id number], etc  Document your independent review of radiology images and any outside records  Document your discussion with family members, caretakers and with consultants  Document  social determinants of health affecting pt's care  Document your decision making why or why not admission, treatments were needed:32947:::1}   Final diagnoses:  Ulcer of toe of right foot, unspecified ulcer stage (HCC)  Hyperkalemia    ED Discharge Orders     None

## 2024-01-14 NOTE — ED Notes (Addendum)
 Patient reports wound/ulcer to right 1st toe since October, hx of ingrown toenail/PAD. No active bleeding/purulent drainage/redness noted. Denies fever/cough/chills. Patient is alert and oriented x 4. Airway patent, respirations even and unlabored. Skin normal, warm and dry. Patient has no new complaints at this time. Siderails up x 1. Call light at bedside.

## 2024-01-14 NOTE — ED Triage Notes (Signed)
 Patient said his right big toe has been infected since October. Began as ingrown toe nail. Tried soaking his toe then the skin began falling off. Says its leaking pus.

## 2024-01-14 NOTE — Discharge Instructions (Signed)
 Call Eleanor Charleston at [phone] for the status of your medication cream.  Thank you for visiting the Emergency Department today. It was a pleasure to be part of your healthcare team.  You were seen today for an ulcer on your right great toe.  On exam, there were no signs of infection.  Above, I provided the information to pick up your medication cream.  If you have any questions about your medicines, please call your pharmacy or healthcare provider. As discussed, please follow-up with your nephrologist at the number provided.  Your laboratory studies showed mildly elevated potassium, and your creatinine is elevated.  It is important to watch for warning signs such as worsening pain or fever. If any of these happen, return to the Emergency Department or call 911. Thank you for trusting us  with your health.

## 2024-01-16 ENCOUNTER — Telehealth: Payer: Self-pay

## 2024-01-16 NOTE — Telephone Encounter (Signed)
 Patient called and left a message - he went to the emergency room on 01/14/24. Ulcer right great toe - toe is still swollen and looks infected - N. Regal patient, needs office visit. Please call patient to schedule [phone]    thanks

## 2024-01-19 ENCOUNTER — Ambulatory Visit: Admitting: Podiatry

## 2024-01-20 NOTE — Telephone Encounter (Signed)
 I  would send in silvadene

## 2024-01-20 NOTE — Telephone Encounter (Signed)
 I can try to send him in substitute if he needs this. Please call him to check

## 2024-01-23 ENCOUNTER — Other Ambulatory Visit: Payer: Self-pay

## 2024-01-23 MED ORDER — SILVER SULFADIAZINE 1 % EX CREA: 1.0000 | TOPICAL_CREAM | Freq: Every day | CUTANEOUS | 0 refills | Status: AC

## 2024-01-23 NOTE — Telephone Encounter (Signed)
 Medication was sent to the pharmacy. [facility] on [address]

## 2024-01-27 ENCOUNTER — Encounter (HOSPITAL_BASED_OUTPATIENT_CLINIC_OR_DEPARTMENT_OTHER): Attending: Internal Medicine | Admitting: Internal Medicine

## 2024-01-27 DIAGNOSIS — L97512 Non-pressure chronic ulcer of other part of right foot with fat layer exposed: Secondary | ICD-10-CM | POA: Diagnosis not present

## 2024-01-27 DIAGNOSIS — S90811A Abrasion, right foot, initial encounter: Secondary | ICD-10-CM | POA: Diagnosis not present

## 2024-01-27 DIAGNOSIS — X58XXXA Exposure to other specified factors, initial encounter: Secondary | ICD-10-CM | POA: Diagnosis not present

## 2024-02-03 ENCOUNTER — Encounter (HOSPITAL_BASED_OUTPATIENT_CLINIC_OR_DEPARTMENT_OTHER): Admitting: Internal Medicine

## 2024-02-03 DIAGNOSIS — S90811A Abrasion, right foot, initial encounter: Secondary | ICD-10-CM

## 2024-02-03 DIAGNOSIS — L97512 Non-pressure chronic ulcer of other part of right foot with fat layer exposed: Secondary | ICD-10-CM

## 2024-02-03 DIAGNOSIS — N184 Chronic kidney disease, stage 4 (severe): Secondary | ICD-10-CM

## 2024-02-13 ENCOUNTER — Other Ambulatory Visit (HOSPITAL_COMMUNITY): Payer: Self-pay | Admitting: Internal Medicine

## 2024-02-13 DIAGNOSIS — L97512 Non-pressure chronic ulcer of other part of right foot with fat layer exposed: Secondary | ICD-10-CM

## 2024-02-20 ENCOUNTER — Ambulatory Visit (HOSPITAL_COMMUNITY)
Admission: RE | Admit: 2024-02-20 | Discharge: 2024-02-20 | Disposition: A | Discharge status: Home / Self Care | Source: Ambulatory Visit | Attending: Internal Medicine | Admitting: Internal Medicine

## 2024-02-20 ENCOUNTER — Encounter (HOSPITAL_COMMUNITY): Payer: Self-pay

## 2024-02-20 DIAGNOSIS — L97512 Non-pressure chronic ulcer of other part of right foot with fat layer exposed: Secondary | ICD-10-CM | POA: Diagnosis present

## 2024-02-24 ENCOUNTER — Encounter (HOSPITAL_BASED_OUTPATIENT_CLINIC_OR_DEPARTMENT_OTHER): Attending: Internal Medicine | Admitting: Internal Medicine

## 2024-02-24 DIAGNOSIS — S90811A Abrasion, right foot, initial encounter: Secondary | ICD-10-CM | POA: Diagnosis not present

## 2024-02-24 DIAGNOSIS — N184 Chronic kidney disease, stage 4 (severe): Secondary | ICD-10-CM | POA: Diagnosis not present

## 2024-02-24 DIAGNOSIS — L97512 Non-pressure chronic ulcer of other part of right foot with fat layer exposed: Secondary | ICD-10-CM | POA: Insufficient documentation

## 2024-03-02 ENCOUNTER — Encounter (HOSPITAL_BASED_OUTPATIENT_CLINIC_OR_DEPARTMENT_OTHER): Admitting: Internal Medicine

## 2024-03-02 DIAGNOSIS — N184 Chronic kidney disease, stage 4 (severe): Secondary | ICD-10-CM | POA: Diagnosis not present

## 2024-03-02 DIAGNOSIS — S90811A Abrasion, right foot, initial encounter: Secondary | ICD-10-CM

## 2024-03-02 DIAGNOSIS — L97512 Non-pressure chronic ulcer of other part of right foot with fat layer exposed: Secondary | ICD-10-CM

## 2024-03-14 ENCOUNTER — Ambulatory Visit: Admitting: Podiatry

## 2024-03-14 DIAGNOSIS — M86671 Other chronic osteomyelitis, right ankle and foot: Secondary | ICD-10-CM

## 2024-03-14 DIAGNOSIS — L97512 Non-pressure chronic ulcer of other part of right foot with fat layer exposed: Secondary | ICD-10-CM

## 2024-03-14 DIAGNOSIS — I999 Unspecified disorder of circulatory system: Secondary | ICD-10-CM | POA: Diagnosis not present

## 2024-03-14 NOTE — Progress Notes (Unsigned)
 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

## 2024-03-14 NOTE — Patient Instructions (Signed)

## 2024-03-16 ENCOUNTER — Encounter (HOSPITAL_BASED_OUTPATIENT_CLINIC_OR_DEPARTMENT_OTHER): Admitting: Internal Medicine

## 2024-03-28 ENCOUNTER — Ambulatory Visit: Admitting: Podiatry
